# Patient Record
Sex: Female | Born: 1955 | Race: White | Hispanic: No | Smoking: Never smoker
Health system: Southern US, Community
[De-identification: ages and names within clinical notes are randomized; demographics above are authoritative.]

## PROBLEM LIST (undated history)

## (undated) DIAGNOSIS — K5792 Diverticulitis of intestine, part unspecified, without perforation or abscess without bleeding: Secondary | ICD-10-CM

## (undated) DIAGNOSIS — I10 Essential (primary) hypertension: Secondary | ICD-10-CM

## (undated) HISTORY — PX: ABDOMINAL HYSTERECTOMY: SHX81

## (undated) HISTORY — PX: TONSILLECTOMY: SUR1361

## (undated) HISTORY — DX: Essential (primary) hypertension: I10

## (undated) HISTORY — PX: CHOLECYSTECTOMY: SHX55

## (undated) HISTORY — PX: KNEE SURGERY: SHX244

---

## 2001-03-25 ENCOUNTER — Encounter: Payer: Self-pay | Admitting: Family Medicine

## 2001-03-25 ENCOUNTER — Ambulatory Visit (HOSPITAL_COMMUNITY): Admission: RE | Admit: 2001-03-25 | Discharge: 2001-03-25 | Payer: Self-pay | Admitting: Family Medicine

## 2001-06-09 ENCOUNTER — Ambulatory Visit (HOSPITAL_COMMUNITY): Admission: RE | Admit: 2001-06-09 | Discharge: 2001-06-09 | Payer: Self-pay | Admitting: Family Medicine

## 2001-06-09 ENCOUNTER — Encounter: Payer: Self-pay | Admitting: Family Medicine

## 2001-07-16 ENCOUNTER — Ambulatory Visit (HOSPITAL_COMMUNITY): Admission: RE | Admit: 2001-07-16 | Discharge: 2001-07-16 | Payer: Self-pay | Admitting: General Surgery

## 2002-10-20 ENCOUNTER — Ambulatory Visit (HOSPITAL_COMMUNITY): Admission: RE | Admit: 2002-10-20 | Discharge: 2002-10-20 | Payer: Self-pay | Admitting: Family Medicine

## 2002-10-20 ENCOUNTER — Encounter: Payer: Self-pay | Admitting: Family Medicine

## 2002-10-25 ENCOUNTER — Encounter: Payer: Self-pay | Admitting: *Deleted

## 2002-10-25 ENCOUNTER — Emergency Department (HOSPITAL_COMMUNITY): Admission: EM | Admit: 2002-10-25 | Discharge: 2002-10-25 | Payer: Self-pay | Admitting: *Deleted

## 2002-11-17 ENCOUNTER — Emergency Department (HOSPITAL_COMMUNITY): Admission: EM | Admit: 2002-11-17 | Discharge: 2002-11-17 | Payer: Self-pay | Admitting: Internal Medicine

## 2002-11-17 ENCOUNTER — Encounter: Payer: Self-pay | Admitting: Internal Medicine

## 2002-11-18 ENCOUNTER — Encounter: Payer: Self-pay | Admitting: Family Medicine

## 2002-11-18 ENCOUNTER — Ambulatory Visit (HOSPITAL_COMMUNITY): Admission: RE | Admit: 2002-11-18 | Discharge: 2002-11-18 | Payer: Self-pay | Admitting: Family Medicine

## 2002-11-23 ENCOUNTER — Encounter (HOSPITAL_COMMUNITY): Admission: RE | Admit: 2002-11-23 | Discharge: 2002-12-23 | Payer: Self-pay | Admitting: Family Medicine

## 2002-11-23 ENCOUNTER — Encounter: Payer: Self-pay | Admitting: Family Medicine

## 2002-12-09 ENCOUNTER — Ambulatory Visit (HOSPITAL_COMMUNITY): Admission: RE | Admit: 2002-12-09 | Discharge: 2002-12-09 | Payer: Self-pay | Admitting: Internal Medicine

## 2002-12-16 ENCOUNTER — Observation Stay (HOSPITAL_COMMUNITY): Admission: RE | Admit: 2002-12-16 | Discharge: 2002-12-17 | Payer: Self-pay | Admitting: General Surgery

## 2005-04-09 ENCOUNTER — Encounter (HOSPITAL_COMMUNITY): Admission: RE | Admit: 2005-04-09 | Discharge: 2005-04-10 | Payer: Self-pay | Admitting: Family Medicine

## 2005-04-10 ENCOUNTER — Ambulatory Visit: Payer: Self-pay | Admitting: Orthopedic Surgery

## 2006-09-24 ENCOUNTER — Observation Stay (HOSPITAL_COMMUNITY): Admission: RE | Admit: 2006-09-24 | Discharge: 2006-09-25 | Payer: Self-pay | Admitting: Obstetrics and Gynecology

## 2006-09-24 ENCOUNTER — Encounter (INDEPENDENT_AMBULATORY_CARE_PROVIDER_SITE_OTHER): Payer: Self-pay | Admitting: Specialist

## 2006-11-23 ENCOUNTER — Ambulatory Visit (HOSPITAL_COMMUNITY): Admission: RE | Admit: 2006-11-23 | Discharge: 2006-11-23 | Payer: Self-pay | Admitting: Family Medicine

## 2007-02-02 ENCOUNTER — Encounter (INDEPENDENT_AMBULATORY_CARE_PROVIDER_SITE_OTHER): Payer: Self-pay | Admitting: General Surgery

## 2007-02-02 ENCOUNTER — Ambulatory Visit (HOSPITAL_COMMUNITY): Admission: RE | Admit: 2007-02-02 | Discharge: 2007-02-02 | Payer: Self-pay | Admitting: General Surgery

## 2007-02-05 ENCOUNTER — Emergency Department (HOSPITAL_COMMUNITY): Admission: EM | Admit: 2007-02-05 | Discharge: 2007-02-05 | Payer: Self-pay | Admitting: Emergency Medicine

## 2007-04-10 ENCOUNTER — Emergency Department (HOSPITAL_COMMUNITY): Admission: EM | Admit: 2007-04-10 | Discharge: 2007-04-10 | Payer: Self-pay | Admitting: Emergency Medicine

## 2007-06-10 ENCOUNTER — Encounter (INDEPENDENT_AMBULATORY_CARE_PROVIDER_SITE_OTHER): Payer: Self-pay | Admitting: Internal Medicine

## 2007-08-23 ENCOUNTER — Encounter: Payer: Self-pay | Admitting: Emergency Medicine

## 2007-08-25 ENCOUNTER — Ambulatory Visit (HOSPITAL_COMMUNITY): Admission: EM | Admit: 2007-08-25 | Discharge: 2007-08-26 | Payer: Self-pay | Admitting: Cardiology

## 2007-08-26 ENCOUNTER — Encounter (INDEPENDENT_AMBULATORY_CARE_PROVIDER_SITE_OTHER): Payer: Self-pay | Admitting: Cardiology

## 2007-08-26 ENCOUNTER — Ambulatory Visit: Payer: Self-pay | Admitting: Vascular Surgery

## 2008-10-12 ENCOUNTER — Ambulatory Visit (HOSPITAL_COMMUNITY): Admission: RE | Admit: 2008-10-12 | Discharge: 2008-10-12 | Payer: Self-pay | Admitting: Family Medicine

## 2009-09-24 ENCOUNTER — Emergency Department (HOSPITAL_COMMUNITY): Admission: EM | Admit: 2009-09-24 | Discharge: 2009-09-24 | Payer: Self-pay | Admitting: Emergency Medicine

## 2010-08-18 ENCOUNTER — Emergency Department (HOSPITAL_COMMUNITY)
Admission: EM | Admit: 2010-08-18 | Discharge: 2010-08-18 | Payer: Self-pay | Source: Home / Self Care | Admitting: Emergency Medicine

## 2010-08-26 LAB — URINALYSIS, ROUTINE W REFLEX MICROSCOPIC
Bilirubin Urine: NEGATIVE
Hgb urine dipstick: NEGATIVE
Ketones, ur: NEGATIVE mg/dL
Nitrite: NEGATIVE
Protein, ur: NEGATIVE mg/dL
Specific Gravity, Urine: >1.03 — ABNORMAL HIGH (ref 1.005–1.030)
Urine Glucose, Fasting: NEGATIVE mg/dL
Urobilinogen, UA: 0.2 mg/dL (ref 0.0–1.0)
pH: 5.5 (ref 5.0–8.0)

## 2010-08-26 LAB — DIFFERENTIAL
Basophils Absolute: 0 10*3/uL (ref 0.0–0.1)
Basophils Relative: 0 % (ref 0–1)
Eosinophils Absolute: 0.2 10*3/uL (ref 0.0–0.7)
Eosinophils Relative: 2 % (ref 0–5)
Lymphocytes Relative: 47 % — ABNORMAL HIGH (ref 12–46)
Lymphs Abs: 3.3 10*3/uL (ref 0.7–4.0)
Monocytes Absolute: 0.4 10*3/uL (ref 0.1–1.0)
Monocytes Relative: 6 % (ref 3–12)
Neutro Abs: 3.1 10*3/uL (ref 1.7–7.7)
Neutrophils Relative %: 45 % (ref 43–77)

## 2010-08-26 LAB — BASIC METABOLIC PANEL
BUN: 9 mg/dL (ref 6–23)
CO2: 28 mEq/L (ref 19–32)
Calcium: 9.2 mg/dL (ref 8.4–10.5)
Chloride: 99 mEq/L (ref 96–112)
Creatinine, Ser: 0.8 mg/dL (ref 0.4–1.2)
GFR calc Af Amer: >60 mL/min (ref >60)
GFR calc non Af Amer: >60 mL/min (ref >60)
Glucose, Bld: 210 mg/dL — ABNORMAL HIGH (ref 70–99)
Potassium: 3.4 mEq/L — ABNORMAL LOW (ref 3.5–5.1)
Sodium: 135 mEq/L (ref 135–145)

## 2010-08-26 LAB — CBC
HCT: 39.2 % (ref 36.0–46.0)
Hemoglobin: 13.5 g/dL (ref 12.0–15.0)
MCH: 28.9 pg (ref 26.0–34.0)
MCHC: 34.4 g/dL (ref 30.0–36.0)
MCV: 83.9 fL (ref 78.0–100.0)
Platelets: 224 10*3/uL (ref 150–400)
RBC: 4.67 MIL/uL (ref 3.87–5.11)
RDW: 12.9 % (ref 11.5–15.5)
WBC: 7 10*3/uL (ref 4.0–10.5)

## 2010-09-01 ENCOUNTER — Encounter: Payer: Self-pay | Admitting: Internal Medicine

## 2010-10-30 LAB — URINALYSIS, ROUTINE W REFLEX MICROSCOPIC
Bilirubin Urine: NEGATIVE
Glucose, UA: NEGATIVE mg/dL
Hgb urine dipstick: NEGATIVE
Nitrite: NEGATIVE
Protein, ur: NEGATIVE mg/dL
Specific Gravity, Urine: 1.025 (ref 1.005–1.030)
Urobilinogen, UA: 0.2 mg/dL (ref 0.0–1.0)
pH: 6 (ref 5.0–8.0)

## 2010-10-30 LAB — GLUCOSE, CAPILLARY: Glucose-Capillary: 141 mg/dL — ABNORMAL HIGH (ref 70–99)

## 2010-12-24 NOTE — Procedures (Signed)
Denise Hartman, Denise Hartman                 ACCOUNT NO.:  [id number]   MEDICAL RECORD NO.:  [id number]          PATIENT TYPE:  OBV   LOCATION:  A203                          FACILITY:  [facility]   PHYSICIAN:  Dani Gobble, MD       DATE OF BIRTH:  13-Aug-1955   DATE OF PROCEDURE:  DATE OF DISCHARGE:                                ECHOCARDIOGRAM   REFERRING PHYSICIAN:  Dorris Singh, D.O.  Dani Gobble, M.D.   INDICATIONS:  A 55 year old female with a past medical history of  hypertension, diabetes mellitus, shortness of breath and chest pain.   TECHNICAL QUALITY:  The study is adequate.   The aorta measures normally at 2.6 cm.   No suggestion of aneurysm or dissection in the aortic root and proximal  ascending aorta that was visualized.   The left atrium grossly appears to be normal in size.  The patient  appeared to be in sinus rhythm during the procedure.   The interventricular septum and posterior wall were within normal limits  at 0.9 cm and 1.1 cm, respectively.   The aortic valve is trileaflet and pliable with normal leaflet  excursion.  No aortic insufficiency is noted.  Doppler interrogation of  the aortic valve is within normal limits.   The mitral valve reveals a minimally thickened anterior leaflet but  without limitation of leaflet excursion.  Mild mitral regurgitation is  noted.  Doppler interrogation of the mitral valve is within normal  limits.   The pulmonic valve appears grossly structurally normal.   Tricuspid valve also appears grossly structurally normal with mild  tricuspid regurgitation noted.   The left ventricle is normal in size with the LV IDD measured at 4.3 cm,  LV IC measured at 3.3 cm.  Overall left systolic function is normal and  no regional wall motion abnormalities are noted.   The right atrium appears mildly dilated where the right ventricle  appears moderately dilated with preserved right ventricular systolic  function.   There is a small  anterior echo-free space which most likely represents a  fat pad, but the possibility of a small loculated pericardial effusion  cannot be entirely excluded.  However, there is no evidence of  hemodynamic compromise.   IMPRESSION:  1. Mild mitral and tricuspid regurgitation.  2. Mildly thickened anterior leaflet of the mitral valve without      limitation leaflet excursion.  3. Normal left ventricular size and systolic function without regional      wall motion abnormality noted.  4. Mild right atrial enlargement.  5. Moderate right ventricular enlargement with preserved right      ventricular systolic function.  6. Small anterior echo-free space without hemodynamic compromise which      may represent fat pad or a small insignificant loculated      pericardial effusion.           ______________________________  Dani Gobble, MD     AB/MEDQ  D:  08/24/2007  T:  08/25/2007  Job:  [id number]   cc:   Dorris Singh, DO   Dani Gobble, MD  Fax: [phone]

## 2010-12-24 NOTE — Discharge Summary (Signed)
Denise Hartman, Denise Hartman                 ACCOUNT NO.:  [id number]   MEDICAL RECORD NO.:  [id number]          PATIENT TYPE:  INP   LOCATION:  2022                         FACILITY:  [facility]   PHYSICIAN:  Dani Gobble, MD       DATE OF BIRTH:  [birth year]   DATE OF ADMISSION:  08/25/2007  DATE OF DISCHARGE:  08/26/2007                               DISCHARGE SUMMARY   DISCHARGE DIAGNOSES:  1. Chest pain, probably musculoskeletal, with catheterization this      admission revealing normal coronaries and normal left ventricular      function.  2. Non-insulin-dependent diabetes.  3. Obesity.  4. Treated hypertension.  5. Treated dyslipidemia.   HOSPITAL COURSE:  The patient is a 55 year old female who was admitted  August 24, 2007, with chest pain while walking around at [facility]  She  has multiple risk factors for coronary disease.  She was seen initially  at [facility] and then transferred to [facility].  A chest CT was  done at [facility], which showed minimal basilar atelectasis  with no pulmonary embolism.  Echocardiogram revealed mild TR, normal LV  function, and a small anterior echo-free space without hemodynamic  compromise, which may represent a fat pad or a small insignificant  pericardial effusion.  The patient underwent a diagnostic  catheterization August 25, 2007, by Dr. Elsie Lincoln, which revealed normal  coronaries and normal LV function.  She tolerated this well.  We were  going to discharge her on the 14th, but she had more chest pain after  her cath.  A PPI was added as well as GI cocktail.  She was seen the  morning of the 14th.  She continued to have some vague mid sternal pain  which was worse with inspiration.  She also had some mild right upper  quadrant pain.  She is status post cholecystectomy in 2003.  She did  have a D-dimer that was trace positive at [facility] and we did lower  extremity venous Dopplers which were negative for DVT.  We gave her a  dose of  Toradol and PPI.  She is better in the afternoon of the 15th and  we feel she can be discharged.  At discharge, she does admit that she is  under a great deal of stress at work, and she thinks this may have  contributed to her symptoms.  We will discharge her on Actos/metformin  850/15 twice a day, Benicar 20 mg daily, Vytorin 10/20 daily, Prilosec  20 mg a day, Naprosyn 250 mg twice a day for 5 days.   LABORATORIES:  White count 15.7, hemoglobin 12.8, hematocrit 37.6,  platelets 254.  Sodium 136, potassium 4, BUN 14, creatinine 0.89.  LFTs  were normal.  CK-MB and troponins were negative x3.  LDL was 59, HDL 57.  Urine culture shows no growth, and H. pylori is pending.  Sed rate is 5.  TSH 1.92.  D-dimer is 0.49 here.   DISPOSITION:  The patient is discharged in stable condition.  She will  follow up with Dr. Domingo Sep in  a couple of weeks.  She will need a  followup CBC before seeing Dr. Domingo Sep.      Abelino Derrick, P.A.    ______________________________  Dani Gobble, MD    LKK/MEDQ  D:  08/26/2007  T:  08/26/2007  Job:  [id number]   cc:   Dani Gobble, MD  Patrica Duel, M.D.

## 2010-12-24 NOTE — Consult Note (Signed)
NAME:  Denise Hartman, Denise Hartman                 ACCOUNT NO.:  [id number]   MEDICAL RECORD NO.:  [id number]          PATIENT TYPE:  EMS   LOCATION:  ED                            FACILITY:  [facility]   PHYSICIAN:  Dalia Heading, M.D.  DATE OF BIRTH:  05-16-1956   DATE OF CONSULTATION:  02/05/2007  DATE OF DISCHARGE:                                 CONSULTATION   REFERRING PHYSICIAN:  Emergency room.   HISTORY OF PRESENT ILLNESS:  The patient is a 55 year old, white female  with a several-week history of intermittent hematochezia and rectal pain  who presented to the emergency room with worsening rectal pain and  hematochezia.  She is status post a colonoscopy with polypectomy earlier  this week.  The polyp was negative for malignancy.  She denies any  abdominal pain.  Her pain is primarily rectal and is made worse with  bowel movements.   PAST MEDICAL HISTORY:  As noted on the chart.   PHYSICAL EXAMINATION:  GENERAL:  The patient is well-developed, well-  nourished, white female in moderate discomfort.  ABDOMEN:  Benign.  RECTAL:  A posterior anal fissure.  Sphincter tone is tight, but this is  probably secondary to pain.  Minimal blood was noted at the time of  rectal examination.   LABORATORY DATA AND X-RAY FINDINGS:  Hematocrit 37.5.   IMPRESSION:  Anal fissure.   RECOMMENDATIONS:  The patient will be started on Dilaudid for pain and  Anusol HC suppositories.  She is to follow up in my office in 1-2 weeks.  She has been told that this is not related to the colonoscopy and should  this problem become chronic in nature, surgery may be indicated.      Dalia Heading, M.D.  Electronically Signed     MAJ/MEDQ  D:  02/05/2007  T:  02/05/2007  Job:  [id number]   cc:   [facility]

## 2010-12-24 NOTE — H&P (Signed)
Denise Hartman, Denise Hartman                 ACCOUNT NO.:  [id number]   MEDICAL RECORD NO.:  [id number]          PATIENT TYPE:  AMB   LOCATION:                                FACILITY:  [facility]   PHYSICIAN:  Dalia Heading, M.D.  DATE OF BIRTH:  1955-09-11   DATE OF ADMISSION:  02/02/2007  DATE OF DISCHARGE:  LH                              HISTORY & PHYSICAL   CHIEF COMPLAINT:  Hematochezia.   HISTORY OF PRESENT ILLNESS:  The patient is a 55 year old white female  who was referred for endoscopic evaluation.  She needs a colonoscopy for  hematochezia. She has been passing some blood per rectum over the past  month.  No abdominal pain, weight loss, nausea, vomiting, diarrhea,  constipation, or melena have been noted. She has never had a  colonoscopy.  There is no family history of colon carcinoma.   PAST MEDICAL HISTORY:  1. Non insulin dependent diabetes mellitus.  2. Hypertension.   PAST SURGICAL HISTORY:  Noncontributory.   CURRENT MEDICATIONS:  1. ACTOplus.  2. Benicar.  3. Vytorin.   ALLERGIES:  SHELLFISH.   REVIEW OF SYSTEMS:  Noncontributory.   PHYSICAL EXAMINATION:  GENERAL:  The patient is a well-developed, well-  nourished white female in no acute distress.  LUNGS:  Clear to auscultation with equal breath sounds bilaterally.  HEART:  Reveals a regular rate and rhythm without S3, S4, or murmurs.  ABDOMEN:  Soft, nontender, nondistended. No hepatosplenomegaly or masses  are noted.  RECTAL EXAMINATION:  Deferred to the procedure.   IMPRESSION:  Hematochezia.   PLAN:  The patient is scheduled for a colonoscopy on February 02, 2007.  The  risks and benefits of the procedure including bleeding and perforation  were fully explained to the patient, gave informed consent.      Dalia Heading, M.D.  Electronically Signed     MAJ/MEDQ  D:  01/26/2007  T:  01/26/2007  Job:  [id number]   cc:   Patrica Duel, M.D.  Fax: [phone]

## 2010-12-24 NOTE — Group Therapy Note (Signed)
Denise Hartman, Denise Hartman                 ACCOUNT NO.:  [id number]   MEDICAL RECORD NO.:  [id number]          PATIENT TYPE:  INP   LOCATION:  A203                          FACILITY:  [facility]   PHYSICIAN:  Skeet Latch, DO    DATE OF BIRTH:  [birth year]   DATE OF PROCEDURE:  08/25/2007  DATE OF DISCHARGE:                                 PROGRESS NOTE   SUBJECTIVE:  The patient states that her pain seems to be improved  today.  The patient still complains of pressure-like sensation in the  middle of her chest this morning, but it seems to be better.  She denies  any vomiting, nausea, fever, chills.  The patient supposedly is going to  be transferred to [facility] for cardiac catheterization.   OBJECTIVE:  VITAL SIGNS:  Temperature 97.6, pulse 90, respirations 18,  blood pressure 129/79, patient saturating 97% on room air.  GENERAL:  She is 55 years old, well-developed, well-nourished, well-  hydrated.  Does not appear in any acute distress. She is pleasant and  cooperative.  CARDIOVASCULAR:  Regular rate and rhythm.  No murmurs, rubs or gallops.  LUNGS:  Clear to auscultation bilaterally.  No rhonchi, rales or  wheezes.  ABDOMEN:  Obese, soft, some epigastric tenderness on deep palpation.  Positive bowel sounds.  EXTREMITIES:  No cyanosis, clubbing or edema is noted.   ASSESSMENT/PLAN:  1. Chest pain.  Will continue current care and await patient to be      transferred to [facility] for cardiac catheterization.  2. Diabetes.  Continue home medications as well as insulin sliding      scale.  3. Hypertension.  Continue current treatment as her blood pressure      seems to be in normal range.      Skeet Latch, DO  Electronically Signed     SM/MEDQ  D:  08/25/2007  T:  08/25/2007  Job:  [id number]

## 2010-12-24 NOTE — H&P (Signed)
Denise Hartman, Denise Hartman                 ACCOUNT NO.:  [id number]   MEDICAL RECORD NO.:  [id number]          PATIENT TYPE:  INP   LOCATION:  A203                          FACILITY:  [facility]   PHYSICIAN:  Dorris Singh, DO    DATE OF BIRTH:  04-Jan-1956   DATE OF ADMISSION:  08/23/2007  DATE OF DISCHARGE:  01/13/2009LH                              HISTORY & PHYSICAL   The patient is a 55 year old female who is a patient of Dr. Nobie Hartman who  presented with chief complaint of chest pain which she says started 2  days ago.  It was gradual in onset.  She stated that it was located in  the substernal region.  There was nothing that made it better and was  rated at 6/10, described as moderate.  There were no relieving factors  and has been associated with nausea.   PAST MEDICAL HISTORY:  1. Hypertension.  2. Diabetes.  3. Hemorrhoids.  4. Hypercholesterolemia.   She has had a cholecystectomy, hysterectomy, knee surgery, oophorectomy,  and tonsillectomy.   She is a nonsmoker, nondrinker, no drug abuse.  She is married.   She has an allergy to SULFA.   She is currently on medications of:  1. Benicar 20 once a day.  2. Vytorin 10/20 once a day.  3. Actos 15/85 twice a day.   REVIEW OF SYSTEMS:  CONSTITUTIONAL:  Negative for weakness or fatigues.  EYES:  Negative for changes in vision.  EARS, NOSE, MOUTH, THROAT:  Negative for ear pain, nose pain, mouth pain, or change in teeth.  CARDIOVASCULAR:  Positive for chest pain.  RESPIRATORY:  Negative for  dyspnea or shortness of breath.  GASTROINTESTINAL:  Positive for nausea.  Negative for constipation or diarrhea. GU:  Negative for dysuria or  hematuria.  MUSCULOSKELETAL:  Negative for arthralgias.  SKIN:  Negative  for any kind of rashes.  NEUROLOGIC:  Negative for headache, dizziness,  or syncope.  PSYCHIATRIC:  Negative for anxiety or depression.  ENDOCRINE:  Negative for intolerance to heat or cold.  HEMATOLOGIC:  Negative for anemia.   PHYSICAL EXAMINATION:  VITAL SIGNS:  Blood pressure 114/58, pulse rate  60, respirations 20,  pulse oximetry 99% on room air.  GENERAL:  Well-developed, well-nourished [name] who is in no  acute distress.  HEENT:  Head is normocephalic and atraumatic.  Eyes are PERRLA.  EOMI.  Ears: TMs visualized bilaterally.  Nose:  No significant turbinates,  moist.  Throat: No erythema or exudate noted.  NECK:  Supple.  No lymphadenopathy.  CARDIOVASCULAR:  Regular rate and rhythm.  No murmurs, rubs, or gallops.  RESPIRATORY:  Clear to auscultation bilaterally.  No wheezes, rales,  rhonchi.  ABDOMEN:  Soft, nontender, nondistended.  No organomegaly.  Bowel sounds  in all four quadrants.  EXTREMITIES:  Positive pulses.  No ecchymosis, edema, or cyanosis.  NEUROLOGIC:  Cranial nerves II-XII grossly intact.  Grip strength, good  strength in all extremities.   LABORATORY DATA:  D-dimer 0.49 which is elevated. Cardiac markers were  negative.  Lipase was 40.  Her chemistry was sodium  138,  potassium 3.9,  chloride 103, carbon dioxide 28,  glucose 117, BUN 9, creatinine 0.79.   Chest x-ray was negative.   She had a CT done which was normal.   INR within normal limits.  CBC within normal limits.   DIAGNOSES:  1. Chest pain.  2. Diabetes.  3. Hypertension.   Will go ahead and admit her to 2A with telemetry.  Will have  [facility] come and participate.  DVT and GI prophylaxes.  Will complete cardiac markers q. 6 h x3.  Will also send for  echocardiogram and will continue to monitor and change therapies as  needed.      Dorris Singh, DO  Electronically Signed     CB/MEDQ  D:  08/23/2007  T:  08/23/2007  Job:  [id number]

## 2010-12-24 NOTE — Op Note (Signed)
Denise Hartman, Denise Hartman                 ACCOUNT NO.:  [id number]   MEDICAL RECORD NO.:  [id number]          PATIENT TYPE:  OUT   LOCATION:  CATH                         FACILITY:  [facility]   PHYSICIAN:  Madaline Savage, M.D.DATE OF BIRTH:  January 25, 1956   DATE OF PROCEDURE:  08/25/2007  DATE OF DISCHARGE:                               OPERATIVE REPORT   PROCEDURE:  1. Selective coronary angiography by Judkins technique.  2. Retrograde left heart catheterization.  3. Left ventricular angiography.  4. Right heart catheterization with hemodynamic pressure acquisition.  5. Thermodilution and Fick cardiac output determinations.   INTERVENTIONS:  None.   PATIENT PROFILE:  The patient is a 55 year old patient of Patrica Duel,  M.D. who presented to [facility] with a chief  complaint of chest pain starting two days ago before being admitted to  [facility] on August 23, 2007.  It was a substernal  pain that was 6/10 described as being moderate and there were no  relieving factors.  It was associated with nausea.  Apparently no  nitroglycerin was given for the pain.  Past history shows that the  patient has had hypertension, diabetes, hypercholesterolemia, and her  previous history is that of previous cholecystectomy, hysterectomy, knee  surgery, tonsillectomy, and oophorectomy.   The patient is a nonsmoker, nondrinker, and non drug abuser who is  married.  While hospitalized at [facility] the patient  had negative cardiac enzymes.  She did have a shellfish allergy that we  premedicated her for today with steroids and famotidine.  This cardiac  catheterization was performed in lab 6 at [facility] without  complications and a right and left heart catheterization was performed.   RESULTS:   PRESSURES:  Right atrial mean pressure was 6, right ventricular pressure  35/1, end diastolic pressure 9, pulmonary artery pressure 34/14, mean  of  26.  Pulmonary capillary wedge mean pressure was 22, left ventricular  pressure 120/1, end diastolic pressure 16.  Central aortic pressure  120/64.  Fick cardiac outputs 3.83, thermodilution cardiac outputs 3.07.   ANGIOGRAPHY:  The coronary arteries were all normal without any evidence  of plaque formation or arterial narrowing.  The right coronary artery  was dominant giving rise to a posterolateral equal in size to the large  posterior descending branch.  The circumflex was nondominant.  The LAD  contained one diagonal branch arising before the septal perforator  branch #1 and both LAD and diagonal were normal.   IMPRESSION:  1. Recent chest pain in patient with multiple risk factors.  2. Angiographically patent coronary arteries.  3. Mildly elevated right heart pressures with normal left ventricular      pressures.  4. Normal cardiac outputs.   PLAN:  The patient will be observed here at [facility] (the  patient was transferred from [facility] as an  outpatient).  She will be observed here at [facility] for approximately six  hours and ambulated and if she does well with her ambulation and there  are no complications, she will  be discharged back to [place] to the  excellent care of Dr. Nobie Putnam and the excellent care of Dr. Domingo Sep,  her cardiologist.           ______________________________  Madaline Savage, M.D.     WHG/MEDQ  D:  08/25/2007  T:  08/25/2007  Job:  [id number]   cc:   Patrica Duel, M.D.  Fax: [phone]   Dani Gobble, MD  Fax: [phone]

## 2010-12-24 NOTE — Group Therapy Note (Signed)
Denise Hartman, Denise Hartman                 ACCOUNT NO.:  [id number]   MEDICAL RECORD NO.:  [id number]          PATIENT TYPE:  INP   LOCATION:  A203                          FACILITY:  [facility]   PHYSICIAN:  Dorris Singh, DO    DATE OF BIRTH:  07-31-1956   DATE OF PROCEDURE:  DATE OF DISCHARGE:  08/25/2007                                 PROGRESS NOTE   She had one episode of chest pain that was resolved with pain  medication.  Denies any nausea, vomiting, fever or chills over the  night.  She was seen by Cardiology today and await their further  recommendations and testing.   Her vitals for today are as follows:  Blood pressure is 96/51,  temperature 98.7, pulse 66, respirations 16.  GENERALLY:  This is a 55 year old female who is well-developed, well-  nourished, in no acute distress.  HEART:  Regular rate and rhythm, no murmur noted, S1 and S2 present.  LUNGS:  Clear to auscultation bilaterally.  No wheezes, rales or  rhonchi.  ABDOMEN:  Soft, nontender, nondistended, no organomegaly noted.  EXTREMITIES:  Positive pulses x4.  No edema, ecchymosis or cyanosis.   Her labs for this morning are as follows:  Her CBC, he hemoglobin is  less than 0.6 and hematocrit 34.4, may be dilutional effect.  Will  continue to monitor and platelet count of 244.  Her BMET, everything is  within normal limits except for her glucose which was 134 at the time  that it was taken.  The cardiac enzymes today are within normal limits  and her lipid studies, her total cholesterol is 132 with her LDL 59 and  her HDL at 57.   ASSESSMENT/PLAN:  1. Chest pain.  2. Diabetes.  3. Hypertension.   Await any further recommendations from Cardiology.  Once patient is  cleared, will consider discharging her to home today, if not in the next  24 hours.      Dorris Singh, DO  Electronically Signed     CB/MEDQ  D:  08/24/2007  T:  08/24/2007  Job:  [id number]

## 2010-12-27 NOTE — Op Note (Signed)
NAME:  Denise Hartman, Denise Hartman                 ACCOUNT NO.:  [id number]   MEDICAL RECORD NO.:  [id number]          PATIENT TYPE:  OBV   LOCATION:  A428                          FACILITY:  [facility]   PHYSICIAN:  Tilda Burrow, M.D. DATE OF BIRTH:  [birth year]   DATE OF PROCEDURE:  09/24/2006  DATE OF DISCHARGE:                               OPERATIVE REPORT   PREOPERATIVE DIAGNOSIS:  Pelvic pain, suspected ovarian entrapment, left  side predominance.   POSTOPERATIVE DIAGNOSIS:  Pelvic pain and pelvic adhesions to both  ovaries.  Extensive upper abdominal adhesions, diffuse lower abdominal  adhesions, thin, filmy.   OPERATION PERFORMED:  Laparoscopic bilateral oophorectomy, lysis of  lower abdominal adhesions ([place]).   SURGEON:  Tilda Burrow, M.D.   ASSISTANTAmie Critchley, CST.   ANESTHESIA:  General.   COMPLICATIONS:  None.   FINDINGS:  Omental adhesions to the anterior abdominal wall just to the  right of the midline, and extensively involving the right upper  quadrant.  Thin filmy adhesions to the anterior abdominal wall, from  lower abdominal epiploic fat, resolved, corrected.  Adhesions from  sigmoid colon overlying left tube and left ovary.  Right ovary adherent  to the right pelvic brim.   DESCRIPTION OF PROCEDURE:  The patient was taken to the operating room,  prepped and draped in the usual fashion for combined abdominal and  vaginal procedure with low lithotomy leg support.  The patient had a  catheterization with the red Roxan Hockey, which was allowed to drain  passively through the case.  Single toothed tenaculum was attached to  the vaginal cuff for cuff manipulation.  We then proceeded with  umbilical vertical inferior incision and introduced the Veress needle  with caution due to prior supraumbilical site.  Water droplet test was  successful on first attempt and so we insufflated another 9 mmHg  pressure.  Laparoscopic trocar 11 mm was inserted under direct  visualization  and revealed safe insertion, just to the left of omental  attachments to the anterior abdominal wall.  The lower abdomen and  suprapubic site could be visualized by retracting the adhesions to the  patient's right side and then we were able to place a 5 mm suprapubic.  Unipolar cutting scissors were then used to take down thin filmy  adhesions from the omentum to the infraumbilical area so that we could  fully visualize the lower abdomen.  The left lower quadrant trocar was  inserted without difficulty under direct visualization. The suprapubic  trocar was converted to a 12 mm port.  We then proceeded with inspection  of the pelvis and progressive adhesiolysis.  The thin filmy adhesions to  the pelvis were lysed using countertraction and laparoscopic scissors  until the ovary could be visualized.  At this point the ovary was  visualized sufficiently that it was apparent that we could be able to  extract the left tube and ovary laparoscopically.  Then we switched to  Harmonic scalpel.  During all of the laparoscopic unipolar scissors, we  were particularly careful to stay away from the bowel and had no  incidences of concern.   The omental adhesions were left in place unless specifically interfering  with visualization.  We then were able to free up the inferior tip of  the left tube, place it on countertraction with an excellent development  of the left infundibulopelvic ligament.  We were able to cross-clamp the  infundibulopelvic ligament with the Harmonic scalpel and remove the  ovary.  There was no evidence of the tube.  We then were able to remove  this tube and ovary by pulling it into the 12 mm trocar site and  bivalving it and extracting it in toto.  On the right side, we worked  around the omental attachments to the anterior abdominal wall and  identified the right ovary on the pelvic brim overlying the major  vessels.  Counter traction could be applied and it was much more  easily  identified.  A cleavage plane could be successfully identified and using  Harmonic scalpel to detach the peritoneal surfaces from the ovary, we  were able to isolate the right ovary all the way up to the right  infundibulopelvic ligament which was high on the side wall.  The ureter  was not visualized but we were well out of the area where it should be  any concern.  The right IP ligament was then transected using careful  positioning and serial coagulation with Harmonic scalpel.  Hemostasis  was excellent through this portion of the case and photos taken before  and after removal of the right tube.  The photo #1 actually shows the  adhesions of the upper abdomen.  At the end of the case, we inspected  the pelvis, checked for any bleeding, found none.  By the time we  irrigated the pelvis copiously, we left a couple of 100 mL in the  abdomen.  Once the irrigation was clear, deflated the abdomen, and  removed the laparoscopic equipment and closed the Scarpa's fascia with 0  Vicryl in the umbilical and suprapubic sites.  Due to the patient's body  habitus, we were not able to reach the fairly small defect in the fascia  at each site.  The patient then had closure of each incision site with  subcuticular 4-0 Dexon and staple closure of the skin.  Sponge and  needle counts were correct.  The patient went to recovery in good  condition.      Tilda Burrow, M.D.  Electronically Signed     JVF/MEDQ  D:  09/24/2006  T:  09/24/2006  Job:  [id number]

## 2010-12-27 NOTE — Consult Note (Signed)
Denise Hartman, Denise Hartman                             ACCOUNT NO.:  [id number]   MEDICAL RECORD NO.:  [id number]                  PATIENT TYPE:   LOCATION:                                       FACILITY:  [facility]   PHYSICIAN:  Denise Hartman, M.D.                 DATE OF BIRTH:  Jan 20, 1956   DATE OF CONSULTATION:  12/06/2002  DATE OF DISCHARGE:                                   CONSULTATION   REPORT TITLE:  GASTROENTEROLOGY CONSULTATION   REFERRING PHYSICIAN:  Patrica Hartman, M.D.   REASON FOR CONSULTATION:  Nausea, vomiting, upper abdominal pain.   HISTORY OF PRESENT ILLNESS:  The patient is a 55 year old [name],  patient of Dr. Nobie Hartman, who presents today for further evaluation of the  above-stated symptoms.  She states about six weeks ago she developed mid to  left chest/abdominal pain which radiated into her back.  She saw Dr.  Domingo Hartman who performed multiple tests, including a stress test, which were  unremarkable.  The patient states that cardiac etiology was ruled out.   She has also had some nausea, vomiting and upper abdominal pain/right upper  quadrant abdominal pain postprandially.  The symptoms generally occur about  an hour to several hours after eating.  She also notes that if she does not  sit up straight or tends to slouch, she has pain in the right upper quadrant  region.  She has also noticed a knot above her right lower rib margin.  She  has typical heartburn symptoms which are well controlled on Nexium.  Denies  any dysphagia, odynophagia.  Denies any melena or rectal bleeding.  Recently, she has been having more stools than usual, up to 4-5 loose stools  daily.   She has had a HIDA scan with fatty milk challenge which revealed a  gallbladder EF of 39%.  She states that it did not reproduce her pain at  that time.  However, she developed vomiting several hours later.  She also  had abdominal ultrasound which revealed fatty infiltration of the liver but,  otherwise, unremarkable.  Her common bile duct measured 4.5 mm.  She had  blood work done today.  However, results are not available.   CURRENT MEDICATIONS:  1. __________ 250/500 mg 1-1/2 tablets b.i.d.  2. Nexium 40 mg daily.  3. TriCor 160 mg daily.  4. Aspirin 81 mg daily.  5. __________ 10 mg daily.   ALLERGIES:  No known drug allergies.   PAST MEDICAL HISTORY:  1. Noninsulin-dependent diabetes mellitus diagnosed five months ago.  2. Gastroesophageal reflux disease.  3. Hypertriglyceridemia.  4. Seasonal allergies.   PAST SURGICAL HISTORY:  1. Status post tubal ligation.  2. Kidney stone extraction.  3. Tonsillectomy.  4. Hysterectomy with bladder tack.  5. Left knee surgery, arthroscopy x4.   FAMILY HISTORY:  Mother and father both have diabetes mellitus.  No family  history of chronic GI illnesses or colorectal cancer.   SOCIAL HISTORY:  She has been married for four years.  Has no children.  She  is employed with [employer].  Never been a smoker.  Denies any  alcohol use.   REVIEW OF SYSTEMS:  Please see HPI for GI.  GENERAL:  Denies any weight  loss.  CARDIOPULMONARY:  Denies any shortness of breath.  Please see HPI.   PHYSICAL EXAMINATION:  VITAL SIGNS:  Weight 201, height 5 feet 7-1/2 inches,  temperature 98.8, blood pressure 110/72, pulse 80.  GENERAL:  A pleasant, well-nourished, well-developed, [name] in no  acute distress.  SKIN:  Warm and dry.  No jaundice.  HEENT:  Pupils are equal, round and reactive to light.  Conjunctivae pink.  Sclerae nonicteric.  Oropharyngeal mucosa moist and pink.  No lesions,  erythema or exudate.  No lymphadenopathy, thyromegaly, carotid bruits.  CHEST:  Lungs clear to auscultation.  CARDIAC:  Regular rate and rhythm.  Normal S1 and S2.  No murmurs, rubs or  gallops.  ABDOMEN:  Positive bowel sounds.  Obese but symmetrical, soft.  She has  moderate tenderness in the right upper quadrant to deep palpation.   She is  unable to take a deep inspiration with palpation in this area.  No  organomegaly or masses appreciated.  Also above the right lower rib margin  is a mobile, approximately 1-1/2 inch, elongated, palpable mass, possibly a  lipoma.  EXTREMITIES:  No edema.   IMPRESSION:  This is a 55 year old lady with chronic gastroesophageal reflux  disease and a six week history of chest pain associated with right upper  quadrant abdominal pain, nausea and vomiting, particularly postprandially.  A HIDA scan revealed a low normal gallbladder ejection fraction.  She did  develop her typical symptoms several hours after the HIDA scan although not  immediately with the fatty meal.  Abdominal ultrasound revealed fatty liver.  We do need to check her liver function tests, the patient believes these to  have been done today although the results are pending.   After discussion with Dr. Karilyn Hartman, we feel that she needs to have an upper  endoscopy for further evaluation of symptoms.  If this is unremarkable and  does not explain her symptoms, then she may need to have a surgical referral  for possible cholecystectomy.   PLAN:  1. EGD in the near future.  2. Continue Nexium 40 mg p.o. daily.  3. Will follow up on labs done today at Dr. Geanie Hartman office.  4.     Phenergan 25 mg suppositories #30 with one refill, one per rectum q.6 h.     p.r.n. nausea  5. Further recommendations to follow.   I would like to thank Dr. Nobie Hartman for allowing [place] to participate in the  care of this patient.     Denise Hartman. Denise Hartman, P.A.-C                   Denise Hartman, M.D.    LSL/MEDQ  D:  12/06/2002  T:  12/06/2002  Job:  [id number]   cc:   Denise Hartman, M.D.  [address]  [place]  [place] [address]  Fax: [phone]

## 2010-12-27 NOTE — Op Note (Signed)
[facility]  Patient:    Denise Hartman, Denise Hartman Visit Number: [id number] MRN: [id number]          Service Type: DSU Location: DAY Attending Physician:  Barbaraann Barthel Dictated by:   Barbaraann Barthel, M.D. Proc. Date: 07/16/01 Admit Date:  07/16/2001 Discharge Date: 07/16/2001   CC:         Patrica Duel, M.D.   Operative Report  PREOPERATIVE DIAGNOSIS:  Right breast mass, nondiagnostic mammogram.  POSTOPERATIVE DIAGNOSIS:  PROCEDURE:  Right partial mastectomy.  SURGEON:  Barbaraann Barthel, M.D.  ANESTHESIA:  General anesthesia.  NOTE:  This is a 56 year old white female with a palpable nodule in the lower medial aspect of the right breast.  She had no axillary adenopathy, no nipple discharge or skin changes.  Mammogram was nondiagnostic.  This nodule had persisted for some time and the patient was extremely cancer phobic and did not wish a period of observation and requested biopsy.  This seemed reasonable and we planned to admit her for outpatient surgery.  We discussed complications not limited to but including bleeding, infection and the possibility of more surgery might be required.  Informed consent was obtained.  GROSS OPERATIVE FINDINGS:  None consistent to be clinically of carcinoma of the breast.  There appeared to be some fibrocystic changes but no abnormalities that I was grossly able to detect.  Final pathology is pending.  TECHNIQUE:  The patient was placed in supine position and after the adequate administration of general anesthesia, her right hemithorax was prepped with Betadine solution and draped in the usual manner.  The palpable nodule which had been previously marked with a marking pin was then identified and a transverse incision was carried out over the palpated mass; breast tissue and subcutaneous tissue were removed.  There appeared to be prominent fibrocystic tissue was the cause of her palpable nodule.  At any rate, I  did not encounter anything abnormal to my mind.  The wound was then irrigated and the bleeding was controlled with a cautery device.  The breast tissue was approximated with 3-0 Polysorb and the skin was cosmetically closed with a 5-0 subcuticular suture with Steri-Strips used to further approximate the skin.  Prior to closure, all sponge, needle and instrument counts were found to be correct. Estimated blood loss was minimal.  No drains were placed.  Patient received 800 cc of crystalloids intraoperatively and she was taken to recovery room in satisfactory condition. Dictated by:   Barbaraann Barthel, M.D. Attending Physician:  Barbaraann Barthel DD:  07/16/01 TD:  07/16/01 Job: [id number]

## 2010-12-27 NOTE — Op Note (Signed)
NAME:  Denise Hartman, Denise Hartman                           ACCOUNT NO.:  [id number]   MEDICAL RECORD NO.:  [id number]                   PATIENT TYPE:  AMB   LOCATION:  DAY                                  FACILITY:  [facility]   PHYSICIAN:  Lionel December, M.D.                 DATE OF BIRTH:  06/20/1956   DATE OF PROCEDURE:  12/09/2002  DATE OF DISCHARGE:                                 OPERATIVE REPORT   PROCEDURE:  Esophagogastroduodenoscopy.   ENDOSCOPIST:  Lionel December, M.D.   INDICATIONS:  [name] is a 55 year old [name] with over a 6-week  history of chest and abdominal pain as well as nausea and vomiting.  She  also has a history of heartburn which has responded to Nexium, but other  symptoms have not.  Her gallbladder ultrasound was negative for  cholelithiasis.  She had a history of fatty liver.  Her LFTs are normal.  HIDA scan revealed an EF of 39% which is normal.  She also has had  noninvasive cardiac evaluation which was negative.  Her feeling is that her  symptoms were due to gallbladder disease although we have not seen any  convincing evidence.  We, therefore, recommended EGD prior to considering  cholecystectomy.  The procedure and risks were reviewed with the patient and  informed consent was obtained.   PREOPERATIVE MEDICATIONS:  Cetacaine spray for oropharyngeal topical  anesthesia, Demerol 50 mg IV and Versed 8 mg IV in divided dose.   INSTRUMENT:  Olympus video system.   FINDINGS:  Procedure performed in endoscopy suite.  The patient's vital  signs and O2 saturation were monitored during the procedure and remained  stable.  The patient was placed in the left lateral recumbent position and  endoscope was passed via the oropharynx without any difficulty into the  esophagus.   ESOPHAGUS:  Mucosa of the esophagus was normal.  Squamocolumnar junction was  unremarkable.   STOMACH:  It was empty and distended very well with insufflation.  The folds  in the proximal  stomach were normal.  Examination of the mucosa reveals  focal erythema but no erosions or ulcers were noted.  The pyloric channel  was patent.  Angularis, fundus, and cardia were examined by retroflexing the  scope and were normal.   DUODENUM:  Examination of the bulb revealed normal mucosa.  Folds in the  mucosa in the second and third part of the duodenum were also normal.   Endoscope was withdrawn.  The patient tolerated the procedure well.   FINAL DIAGNOSES:  1. Essentially normal esophagogastroduodenoscopy.  2. Focal erythema most likely related to her ASA.  3. Her Helicobacter pylori serology recently was negative.   RECOMMENDATIONS:  She will continue antireflux measures, Nexium, and will  refer the patient for a possible cholecystectomy as discussed with Dr.  Nobie Putnam over the phone.  Lionel December, M.D.    NR/MEDQ  D:  12/09/2002  T:  12/09/2002  Job:  [id number]   cc:   Patrica Duel, M.D.  [address]  [place]  [place] [address]  Fax: [phone]

## 2010-12-27 NOTE — Discharge Summary (Signed)
Denise Hartman, Denise Hartman                 ACCOUNT NO.:  [id number]   MEDICAL RECORD NO.:  [id number]          PATIENT TYPE:  OBV   LOCATION:  A428                          FACILITY:  [facility]   PHYSICIAN:  Tilda Burrow, M.D. DATE OF BIRTH:  1956/01/04   DATE OF ADMISSION:  09/24/2006  DATE OF DISCHARGE:  02/15/2008LH                               DISCHARGE SUMMARY   (Overnight observation)   ADMISSION DIAGNOSIS:  Pelvic pain, suspect ovarian entrapment, left-  sided predominance.   DISCHARGE DIAGNOSIS:  Pelvic pain, ovarian entrapment, extensive pelvic  adhesions.   PROCEDURE:  Laparoscopic bilateral salpingo-oophorectomy, lysis of  adhesions.   DISCHARGE MEDICATION:  Vicodin 5/500 one to two q.4h. p.r.n. pain.   PREVIOUS MEDICATIONS:  1. Benicar 20 mg p.o. daily.  2. Actos plus metformin 15/850 one p.o. daily.  3. Allegra D 180 p.o. daily.  4. Xanax 0.5 mg t.i.d. p.r.n.  5. Nexium p.r.n.   HOSPITAL COURSE:  This 55 year old female many years status post  hysterectomy in 1995, was admitted for progressive pelvic pain and  dyspareunia.  Bilateral salpingo-oophorectomy was planned.  Surgical  findings were extensive thin filmy adhesions in the right upper  quadrant, status post cholecystectomy for acute cholecystitis with thin  filmy adhesions throughout the lower pelvis.  It is my personal  suspicion that the acute upper abdomen inflammatory process resulted in  adhesions throughout the abdomen.   We were able to perform a bilateral salpingo-oophorectomy as described  in the HPI without complications and patient was discharged home after  overnight pain management, in excellent stable condition.      Tilda Burrow, M.D.  Electronically Signed     JVF/MEDQ  D:  09/25/2006  T:  09/25/2006  Job:  [id number]   cc:   [facility]   Patrica Duel, M.D.  Fax: [phone]

## 2010-12-27 NOTE — Procedures (Signed)
   NAME:  Denise Hartman, Denise Hartman                           ACCOUNT NO.:  [id number]   MEDICAL RECORD NO.:  [id number]                   PATIENT TYPE:  EMS   LOCATION:  ED                                   FACILITY:  [facility]   PHYSICIAN:  Edward L. Juanetta Gosling, M.D.             DATE OF BIRTH:  May 05, 1956   DATE OF PROCEDURE:  11/17/2002  DATE OF DISCHARGE:                                EKG INTERPRETATION   DATE AND TIME OF TEST:  November 17, 2002 at 0734.   IMPRESSION:  The rhythm is sinus rhythm with a rate in the 80s.  Normal  electrocardiogram.                                               Oneal Deputy. Juanetta Gosling, M.D.    ELH/MEDQ  D:  11/17/2002  T:  11/17/2002  Job:  [id number]

## 2010-12-27 NOTE — H&P (Signed)
NAME:  Denise Hartman, Denise Hartman                 ACCOUNT NO.:  [id number]   MEDICAL RECORD NO.:  [id number]          PATIENT TYPE:  AMB   LOCATION:  DAY                           FACILITY:  [facility]   PHYSICIAN:  Tilda Burrow, M.D. DATE OF BIRTH:  [birth year]   DATE OF ADMISSION:  DATE OF DISCHARGE:  LH                              HISTORY & PHYSICAL   ADMISSION DIAGNOSIS:  Pelvic pain, suspected ovarian entrapment, left-  sided predominance.   HISTORY OF PRESENT ILLNESS:  This 55 year old year old female, a  longstanding patient of [facility] with no office appointments  in several years who was referred back to our office courtesy of Dr.  Patrica Duel for abdominal pain, left side, which is an abdominal ache  worse for the past three months, increased with activity, worse at the  end of the day with pain on intercourse, particularly on the left side.  Medical history is notable for total abdominal hysterectomy, [facility]  urethropexy Jan 06, 1994 with overall stable course since then, but with  progressive pelvic discomfort symptoms over time. She has had an  ultrasound which reveals a left ovarian cyst fairly close to the vaginal  cuff.  The right ovary is further away from the cuff and is, by  comparison, less uncomfortable, but after discussion of treatment  options, she is inclined to have both tubes and ovaries removed.  She  recognizes that hormone replacement issues will need to be addressed  with the decision either for or against hormone replacement based on  surgical __findings_.  She understands that this may need to be  converted to a laparotomy for problems of access, bleeding, or technical  challenges.  Risks of bowel and bladder, ureters are reviewed.  She  understands that additional procedures are sometimes necessary including  transfusions and we have had a line by line review of surgical consent.   PAST MEDICAL HISTORY:  1. Diabetes.  2. Hypertension.   PAST SURGICAL  HISTORY:  Arthroscopy, left knee x4.  [place] x3, [facility]  x1.  Hysterectomy   MEDICATIONS:  1. Benicar.  2. Actos.  3. Metformin 15/850.  4. Allegra-D 180 daily.  5. Xanax 0.5 mg p.r.n. three times daily.  6. Nexium.  7. Vicodin p.r.n. pain.   ALLERGIES:  None known.   PHYSICAL EXAMINATION:  VITAL SIGNS:  Weight 213, blood pressure 120/78,  height 5 feet 7 inches.  HEENT:  Pupils are equal, round and reactive.  NECK:  Supple. Trachea midline.  ABDOMEN:  Moderate obesity.  EXTERNAL GENITALIA:  Normal.  Vaginal length within normal limits.  Adequate support.  Stress incontinence denied.  Vaginal cuffs smoothly  healed but tender on the left side, particularly with vaginal probe or  bimanual exam.  EXTREMITIES:  Within normal limits. Multiple surgical scars around the  left knee.  The patient is able to walk with normal gait at this time.   PLAN:  Laparoscopic bilateral salpingo-oophorectomy, September 24, 2006.      Tilda Burrow, M.D.  Electronically Signed     JVF/MEDQ  D:  09/23/2006  T:  09/24/2006  Job:  [id number]   cc:   Patrica Duel, M.D.  Fax: [phone]

## 2010-12-27 NOTE — Op Note (Signed)
NAME:  Denise Hartman, Denise Hartman                           ACCOUNT NO.:  [id number]   MEDICAL RECORD NO.:  [id number]                   PATIENT TYPE:  AMB   LOCATION:  DAY                                  FACILITY:  [facility]   PHYSICIAN:  Barbaraann Barthel, M.D.              DATE OF BIRTH:  1956-02-03   DATE OF PROCEDURE:  12/16/2002  DATE OF DISCHARGE:                                 OPERATIVE REPORT   SURGEON:  Barbaraann Barthel, M.D.   PREOPERATIVE DIAGNOSIS:  Cholecystitis secondary to biliary dyskinesia.   POSTOPERATIVE DIAGNOSIS:  Cholecystitis secondary to biliary dyskinesia.   PROCEDURE:  Laparoscopic cholecystectomy.   SPECIMENS:  Gallbladder.   INDICATIONS FOR PROCEDURE:  This is a 55 year old white female who had  recurrent symptoms of right upper quadrant pain, nausea, and postprandial  discomfort with vomiting.  She was evaluated thoroughly by the GI service,  including a sonogram which did not reveal the presence of stones, a  hepatobiliary scan which was marginally normal with a low ejection fraction,  and had an upper GI endoscopy performed.  She was referred for laparoscopic  cholecystectomy for biliary dyskinesia.  Her symptoms certainly suggested  this, and she did not respond to any diet manipulation.  She also did not  respond to Nexium therapy either.   We discussed the need for laparoscopic cholecystectomy, discussing the  complications, not limited to but including bleeding, infection, damage to  bile ducts, perforation of organs, and transitory diarrhea.  We also  discussed that results of cholecystectomy for biliary dyskinesia were not as  satisfying as for cholecystectomy for cholelithiasis.  We discussed this in  detail, and informed consent was obtained.   GROSS OPERATIVE FINDINGS:  The patient had some adhesions around the right  upper quadrant which were taken down with cautery and with lysing these with  the scissors.  She had a small cystic duct which was not  cannulated.  No  stones were noted within the gallbladder.  A very distended gallbladder,  otherwise.  The liver appeared to have some fatty infiltration; otherwise,  the right upper quadrant appeared to be normal.   TECHNIQUE:  The patient was placed in the supine position and after the  adequate administration of general anesthesia via endotracheal intubation,  her entire abdomen was prepped with Betadine solution and draped in the  usual manner.  Prior to this, a Foley catheter was aseptically inserted.   A periumbilical incision was carried out over the superior aspect of the  umbilicus.  With the patient in Trendelenburg, the fascia was grasped with a  sharp towel clip, and a Veress needle was inserted and confirmed the  position with the saline drop test.  We then used the Visiport technique,  and an 11 [employer] cannula was placed in the umbilicus, and then under  direct vision, three other cannulas were placed, an 11-mm cannula in the  epigastrium and two 5-mm cannulas in the right upper quadrant laterally.  The gallbladder was grasped.  Its adhesions were taken down.  The cystic  duct was isolated, triply silver clipped, and divided as was the cystic  artery.  The gallbladder was then removed uneventfully from the liver bed.  The bleeding was controlled with the cautery device.  There was a small  amount of oozing.  I elected to leave Surgicel within the liver bed.  We  then irrigated and checked for hemostasis.  This was deemed to be complete.  We then desufflated the abdomen and then closed the fascia in the area of  the umbilicus with a 0 Polysorb and used 0.5% Sensorcaine to help with  postoperative comfort.  We then closed all other skin incisions with a  stapling device.  Sterile  OpSite dressings were placed.  Prior to closure,  all sponge, needle, and instrument counts were found to be correct.  Estimated blood loss was minimal.  The patient received 1200 cc of   crystalloid intraoperatively.  No drains were placed, and there were no  complications.                                               Barbaraann Barthel, M.D.    Jodi Marble  D:  12/16/2002  T:  12/16/2002  Job:  [id number]   cc:   Dionicia Abler, M.D.

## 2011-04-30 LAB — COMPREHENSIVE METABOLIC PANEL
ALT: 20
ALT: 22
ALT: 24
AST: 17
AST: 19
AST: 21
Albumin: 3.8
Albumin: 4.3
Alkaline Phosphatase: 45
Alkaline Phosphatase: 54
Alkaline Phosphatase: 58
BUN: 7
BUN: 9
CO2: 27
CO2: 28
CO2: 28
Calcium: 9.3
Calcium: 9.5
Chloride: 101
Chloride: 103
Creatinine, Ser: 0.79
Creatinine, Ser: 0.79
GFR calc Af Amer: >60
GFR calc Af Amer: >60
GFR calc Af Amer: >60
GFR calc non Af Amer: >60
GFR calc non Af Amer: >60
Glucose, Bld: 117 — ABNORMAL HIGH
Glucose, Bld: 134 — ABNORMAL HIGH
Glucose, Bld: 142 — ABNORMAL HIGH
Potassium: 3.4 — ABNORMAL LOW
Potassium: 3.9
Potassium: 4
Sodium: 135
Sodium: 138
Sodium: 138
Total Bilirubin: 1.3 — ABNORMAL HIGH
Total Bilirubin: 1.4 — ABNORMAL HIGH
Total Protein: 5.4 — ABNORMAL LOW
Total Protein: 6.7
Total Protein: 7.4

## 2011-04-30 LAB — POCT CARDIAC MARKERS
CKMB, poc: <1 — ABNORMAL LOW
Myoglobin, poc: 72.5
Operator id: 213671
Troponin i, poc: <0.05

## 2011-04-30 LAB — CBC
HCT: 41.1
HCT: 45.1
Hemoglobin: 11.6 — ABNORMAL LOW
Hemoglobin: 13.6
Hemoglobin: 14.9
MCHC: 33
MCHC: 33.2
MCV: 86.4
MCV: 86.6
Platelets: 258
Platelets: 296
RBC: 3.96
RBC: 4.75
RBC: 5.21 — ABNORMAL HIGH
RDW: 13.6
RDW: 14
WBC: 7
WBC: 8.2

## 2011-04-30 LAB — DIFFERENTIAL
Basophils Absolute: 0
Basophils Absolute: 0
Basophils Relative: 0
Basophils Relative: 1
Basophils Relative: 1
Eosinophils Absolute: 0.1
Eosinophils Absolute: 0.2
Eosinophils Absolute: 0.2
Eosinophils Relative: 1
Eosinophils Relative: 2
Eosinophils Relative: 2
Lymphocytes Relative: 48 — ABNORMAL HIGH
Lymphocytes Relative: 52 — ABNORMAL HIGH
Lymphs Abs: 3.6
Lymphs Abs: 3.9
Lymphs Abs: 4
Monocytes Absolute: 0.4
Monocytes Absolute: 0.5
Monocytes Relative: 6
Monocytes Relative: 6
Monocytes Relative: 6
Neutro Abs: 2.8
Neutro Abs: 3.6
Neutrophils Relative %: 39 — ABNORMAL LOW
Neutrophils Relative %: 44
Neutrophils Relative %: 45

## 2011-04-30 LAB — URINALYSIS, ROUTINE W REFLEX MICROSCOPIC
Bilirubin Urine: NEGATIVE
Glucose, UA: NEGATIVE
Hgb urine dipstick: NEGATIVE
Ketones, ur: NEGATIVE
Nitrite: NEGATIVE
Protein, ur: NEGATIVE
Specific Gravity, Urine: 1.01
Urobilinogen, UA: 0.2
pH: 6

## 2011-04-30 LAB — CARDIAC PANEL(CRET KIN+CKTOT+MB+TROPI)
CK, MB: 0.6
CK, MB: 0.9
Relative Index: INVALID
Relative Index: INVALID
Relative Index: INVALID
Total CK: 48
Total CK: 65
Troponin I: 0.03

## 2011-04-30 LAB — PROTIME-INR
INR: 0.9
Prothrombin Time: 12.8

## 2011-04-30 LAB — URINE CULTURE
Colony Count: NO GROWTH
Culture: NO GROWTH
Special Requests: NEGATIVE

## 2011-04-30 LAB — LIPASE, BLOOD: Lipase: 40

## 2011-04-30 LAB — PHOSPHORUS: Phosphorus: 2.7

## 2011-04-30 LAB — B-NATRIURETIC PEPTIDE (CONVERTED LAB): Pro B Natriuretic peptide (BNP): <30

## 2011-04-30 LAB — D-DIMER, QUANTITATIVE: D-Dimer, Quant: 0.49 — ABNORMAL HIGH

## 2011-04-30 LAB — LIPID PANEL
Cholesterol: 132
LDL Cholesterol: 59

## 2011-04-30 LAB — APTT: aPTT: 26

## 2011-04-30 LAB — TSH: TSH: 1.921

## 2011-04-30 LAB — MAGNESIUM: Magnesium: 2.1

## 2011-05-01 LAB — BASIC METABOLIC PANEL
BUN: 14
Calcium: 8.7
Creatinine, Ser: 0.89
GFR calc Af Amer: >60
GFR calc non Af Amer: >60

## 2011-05-01 LAB — POCT I-STAT 3, ART BLOOD GAS (G3+)
Acid-Base Excess: 1
Operator id: 246211
pO2, Arterial: 65 — ABNORMAL LOW

## 2011-05-01 LAB — CBC
Platelets: 254
RBC: 4.31
WBC: 15.7 — ABNORMAL HIGH

## 2011-05-01 LAB — SEDIMENTATION RATE: Sed Rate: 5

## 2011-05-01 LAB — POCT I-STAT 3, VENOUS BLOOD GAS (G3P V)
Acid-base deficit: 2
O2 Saturation: 71
pCO2, Ven: 40.7 — ABNORMAL LOW

## 2011-05-01 LAB — H. PYLORI ANTIBODY, IGG: H Pylori IgG: 0.5

## 2011-05-23 LAB — DIFFERENTIAL
Basophils Relative: 0
Eosinophils Absolute: 0.3
Lymphs Abs: 4.8 — ABNORMAL HIGH
Monocytes Relative: 6
Neutro Abs: 5.2
Neutrophils Relative %: 47

## 2011-05-23 LAB — POCT CARDIAC MARKERS
CKMB, poc: <1 — ABNORMAL LOW
Myoglobin, poc: 23.3
Myoglobin, poc: 29.1
Operator id: 189501
Operator id: 267301
Troponin i, poc: <0.05

## 2011-05-23 LAB — CBC
MCHC: 33.7
MCV: 86
Platelets: 276
RBC: 4.57
WBC: 11 — ABNORMAL HIGH

## 2011-05-23 LAB — BASIC METABOLIC PANEL
BUN: 9
Calcium: 10.3
Creatinine, Ser: 0.82
GFR calc Af Amer: >60

## 2011-05-28 LAB — CBC
HCT: 37.5
Hemoglobin: 12.9
MCHC: 34.3
MCV: 86.1
Platelets: 254
RBC: 4.36
RDW: 12.9
WBC: 10.8 — ABNORMAL HIGH

## 2011-05-28 LAB — COMPREHENSIVE METABOLIC PANEL
ALT: 30
AST: 31
Albumin: 3.7
Calcium: 9.1
Creatinine, Ser: 0.82
GFR calc Af Amer: >60
GFR calc non Af Amer: >60
Sodium: 138
Total Protein: 6.1

## 2011-05-28 LAB — COMPREHENSIVE METABOLIC PANEL WITH GFR
Alkaline Phosphatase: 58
BUN: 13
CO2: 24
Chloride: 106
Glucose, Bld: 98
Potassium: 4
Total Bilirubin: 1

## 2011-05-28 LAB — DIFFERENTIAL
Basophils Absolute: 0
Basophils Relative: 0
Eosinophils Absolute: 0.2
Eosinophils Relative: 2
Lymphocytes Relative: 37
Lymphs Abs: 4 — ABNORMAL HIGH
Monocytes Absolute: 0.6
Monocytes Relative: 5
Neutro Abs: 6
Neutrophils Relative %: 56

## 2011-09-21 ENCOUNTER — Emergency Department (HOSPITAL_COMMUNITY)
Admission: EM | Admit: 2011-09-21 | Discharge: 2011-09-21 | Disposition: A | Discharge status: Home / Self Care | Payer: 59 | Attending: Emergency Medicine | Admitting: Emergency Medicine

## 2011-09-21 ENCOUNTER — Encounter (HOSPITAL_COMMUNITY): Payer: Self-pay

## 2011-09-21 DIAGNOSIS — R05 Cough: Secondary | ICD-10-CM | POA: Insufficient documentation

## 2011-09-21 DIAGNOSIS — R059 Cough, unspecified: Secondary | ICD-10-CM | POA: Insufficient documentation

## 2011-09-21 DIAGNOSIS — R197 Diarrhea, unspecified: Secondary | ICD-10-CM | POA: Insufficient documentation

## 2011-09-21 DIAGNOSIS — R112 Nausea with vomiting, unspecified: Secondary | ICD-10-CM | POA: Insufficient documentation

## 2011-09-21 DIAGNOSIS — IMO0001 Reserved for inherently not codable concepts without codable children: Secondary | ICD-10-CM | POA: Insufficient documentation

## 2011-09-21 DIAGNOSIS — R109 Unspecified abdominal pain: Secondary | ICD-10-CM | POA: Insufficient documentation

## 2011-09-21 DIAGNOSIS — E119 Type 2 diabetes mellitus without complications: Secondary | ICD-10-CM | POA: Insufficient documentation

## 2011-09-21 DIAGNOSIS — Z79899 Other long term (current) drug therapy: Secondary | ICD-10-CM | POA: Insufficient documentation

## 2011-09-21 HISTORY — DX: Diverticulitis of intestine, part unspecified, without perforation or abscess without bleeding: K57.92

## 2011-09-21 LAB — URINE MICROSCOPIC-ADD ON

## 2011-09-21 LAB — URINALYSIS, ROUTINE W REFLEX MICROSCOPIC
Bilirubin Urine: NEGATIVE
Glucose, UA: NEGATIVE mg/dL
Nitrite: NEGATIVE
Specific Gravity, Urine: 1.025 (ref 1.005–1.030)
pH: 5.5 (ref 5.0–8.0)

## 2011-09-21 LAB — PREGNANCY, URINE: Preg Test, Ur: NEGATIVE

## 2011-09-21 MED ORDER — ONDANSETRON HCL 8 MG PO TABS: 8.0000 mg | ORAL_TABLET | Freq: Three times a day (TID) | ORAL | Status: AC | PRN

## 2011-09-21 MED ORDER — SODIUM CHLORIDE 0.9 % IV BOLUS (SEPSIS)
1000.0000 mL | Freq: Once | INTRAVENOUS | Status: AC
  Administered 2011-09-21: 1000 mL via INTRAVENOUS

## 2011-09-21 MED ORDER — ONDANSETRON HCL 4 MG/2ML IJ SOLN
4.0000 mg | Freq: Once | INTRAMUSCULAR | Status: AC
  Administered 2011-09-21: 4 mg via INTRAVENOUS
  Filled 2011-09-21: qty 2

## 2011-09-21 NOTE — ED Provider Notes (Signed)
History     CSN: [id number]  Arrival date & time 09/21/11  1149   First MD Initiated Contact with Patient 09/21/11 1456      Chief Complaint  Patient presents with  . Cough  . Generalized Body Aches  . Nausea  . Emesis  . Diarrhea    (Consider location/radiation/quality/duration/timing/severity/associated sxs/prior treatment) Patient is a 56 y.o. female presenting with cough, vomiting, and diarrhea. The history is provided by the patient.  Cough Pertinent negatives include no chest pain, no chills, no headaches, no sore throat, no shortness of breath and no eye redness.  Emesis  Associated symptoms include cough and diarrhea. Pertinent negatives include no chills, no fever and no headaches.  Diarrhea The primary symptoms include vomiting and diarrhea. Primary symptoms do not include fever, dysuria or rash.  The illness does not include chills or back pain.  pt c/o nvd x past 2 days. Several episodes of each. Diarrhea watery, not bloody or mucousy. Emesis clear, not bloody or bilious. On and off mid to lower abd cramping bil, no constant or focal abd pain. No fever or chills. +body aches. Today mild non productive cough. No cp or sob. No sore throat or other uri c/o. No headache. States was recently on abx, cipro, for uti. No current gu c/o. No flank pain.   Past Medical History  Diagnosis Date  . Diabetes mellitus   . Diverticulitis     Past Surgical History  Procedure Date  . Abdominal hysterectomy   . Tonsillectomy   . Knee surgery     No family history on file.  History  Substance Use Topics  . Smoking status: Never Smoker   . Smokeless tobacco: Not on file  . Alcohol Use: No    OB History    Grav Para Term Preterm Abortions TAB SAB Ect Mult Living                  Review of Systems  Constitutional: Negative for fever and chills.  HENT: Negative for sore throat and neck pain.   Eyes: Negative for redness.  Respiratory: Positive for cough. Negative for  shortness of breath.   Cardiovascular: Negative for chest pain.  Gastrointestinal: Positive for vomiting and diarrhea.  Genitourinary: Negative for dysuria and flank pain.  Musculoskeletal: Negative for back pain.  Skin: Negative for rash.  Neurological: Negative for headaches.  Hematological: Does not bruise/bleed easily.  Psychiatric/Behavioral: Negative for confusion.    Allergies  Shellfish allergy and Oxycodone  Home Medications   Current Outpatient Rx  Name Route Sig Dispense Refill  . ACETAMINOPHEN 500 MG PO TABS Oral Take 500 mg by mouth every 6 (six) hours as needed. For pain    . ARTIFICIAL TEAR OP Ophthalmic Apply 1 drop to eye as needed. For dry eyes    . CIPROFLOXACIN HCL 250 MG PO TABS Oral Take 250 mg by mouth 2 (two) times daily.    Marland Kitchen GLIPIZIDE ER 2.5 MG PO TB24 Oral Take 2.5 mg by mouth daily.    Marland Kitchen METFORMIN HCL 500 MG PO TABS Oral Take 500 mg by mouth 2 (two) times daily.      BP 127/59  Pulse 73  Temp(Src) 98.2 F (36.8 C) (Oral)  Resp 20  Ht 5\' 7"  (1.702 m)  Wt 201 lb (91.173 kg)  BMI 31.48 kg/m2  SpO2 100%  Physical Exam  Nursing note and vitals reviewed. Constitutional: She is oriented to person, place, and time. She appears well-developed. No  distress.  HENT:  Mouth/Throat: Oropharynx is clear and moist.  Eyes: Conjunctivae are normal. No scleral icterus.  Neck: Neck supple. No tracheal deviation present.  Cardiovascular: Normal rate, regular rhythm, normal heart sounds and intact distal pulses.  Exam reveals no gallop and no friction rub.   No murmur heard. Pulmonary/Chest: Effort normal and breath sounds normal. No respiratory distress.  Abdominal: Soft. Normal appearance and bowel sounds are normal. She exhibits no distension and no mass. There is no tenderness. There is no rebound and no guarding.  Genitourinary:       No cva tenderness  Musculoskeletal: She exhibits no edema.  Neurological: She is alert and oriented to person, place, and  time.  Skin: Skin is warm and dry. No rash noted.  Psychiatric: She has a normal mood and affect.    ED Course  Procedures (including critical care time)   Labs Reviewed  PREGNANCY, URINE  URINALYSIS, ROUTINE W REFLEX MICROSCOPIC    Results for orders placed during the hospital encounter of 09/21/11  PREGNANCY, URINE      Component Value Range   Preg Test, Ur NEGATIVE  NEGATIVE   URINALYSIS, ROUTINE W REFLEX MICROSCOPIC      Component Value Range   Color, Urine YELLOW  YELLOW    APPearance CLOUDY (*) CLEAR    Specific Gravity, Urine 1.025  1.005 - 1.030    pH 5.5  5.0 - 8.0    Glucose, UA NEGATIVE  NEGATIVE (mg/dL)   Hgb urine dipstick TRACE (*) NEGATIVE    Bilirubin Urine NEGATIVE  NEGATIVE    Ketones, ur NEGATIVE  NEGATIVE (mg/dL)   Protein, ur NEGATIVE  NEGATIVE (mg/dL)   Urobilinogen, UA 0.2  0.0 - 1.0 (mg/dL)   Nitrite NEGATIVE  NEGATIVE    Leukocytes, UA SMALL (*) NEGATIVE   URINE MICROSCOPIC-ADD ON      Component Value Range   Squamous Epithelial / LPF MANY (*) RARE    WBC, UA 7-10  <3 (WBC/hpf)   RBC / HPF 7-10  <3 (RBC/hpf)   Bacteria, UA MANY (*) RARE       MDM  Iv ns bolus. zofran iv.   Additional ivf. No vomiting or diarrhea in ed. abd soft nt. Discussed ua, as still on cipro, and ?contam spec vs true uti, will culture.         Suzi Roots, MD 09/21/11 [phone]

## 2011-09-21 NOTE — ED Notes (Signed)
Patient ambulatory to restroom with steady gait.

## 2011-09-21 NOTE — ED Notes (Signed)
Pt presents with cough, n/v/d, and body aches since Wednesday.

## 2011-09-21 NOTE — ED Notes (Signed)
Patient with no complaints at this time. Respirations even and unlabored. Skin warm/dry. Discharge instructions reviewed with patient at this time. Patient given opportunity to voice concerns/ask questions. IV removed per policy and band-aid applied to site. Patient discharged at this time and left Emergency Department with steady gait.  

## 2011-09-22 LAB — URINE CULTURE

## 2012-06-21 ENCOUNTER — Encounter (HOSPITAL_COMMUNITY): Payer: Self-pay

## 2012-06-21 ENCOUNTER — Emergency Department (HOSPITAL_COMMUNITY)
Admission: EM | Admit: 2012-06-21 | Discharge: 2012-06-21 | Disposition: A | Discharge status: Home / Self Care | Payer: 59 | Source: Home / Self Care

## 2012-06-21 DIAGNOSIS — R071 Chest pain on breathing: Secondary | ICD-10-CM

## 2012-06-21 DIAGNOSIS — J40 Bronchitis, not specified as acute or chronic: Secondary | ICD-10-CM

## 2012-06-21 MED ORDER — CETIRIZINE HCL 10 MG PO CHEW: 10.0000 mg | CHEWABLE_TABLET | Freq: Every day | ORAL | Status: DC

## 2012-06-21 MED ORDER — ALBUTEROL SULFATE HFA 108 (90 BASE) MCG/ACT IN AERS: 2.0000 | INHALATION_SPRAY | RESPIRATORY_TRACT | Status: DC | PRN

## 2012-06-21 MED ORDER — METHYLPREDNISOLONE 4 MG PO KIT: PACK | ORAL | Status: DC

## 2012-06-21 NOTE — ED Provider Notes (Signed)
Medical screening examination/treatment/procedure(s) were performed by resident physician or non-physician practitioner and as supervising physician I was immediately available for consultation/collaboration.   Zondra Lawlor DOUGLAS MD.    Stuart Guillen D Marciel Offenberger, MD 06/21/12 2045 

## 2012-06-21 NOTE — ED Provider Notes (Signed)
History     CSN: [id number]  Arrival date & time 06/21/12  1602   None     Chief Complaint  Patient presents with  . Cough    (Consider location/radiation/quality/duration/timing/severity/associated sxs/prior treatment) Patient is a 56 y.o. female presenting with cough. The history is provided by the patient.  Cough This is a recurrent problem. The problem occurs every few minutes. The problem has been gradually worsening. The cough is non-productive. There has been no fever. Associated symptoms include chest pain, chills and sore throat. She has tried cough syrup for the symptoms. The treatment provided mild relief. She is not a smoker (spouse smokes). Her past medical history is significant for bronchitis and pneumonia. Her past medical history does not include asthma.  prescribed zpack 3 weeks ago by pcp, states cough remains not relieved by cough syrup.  Past Medical History  Diagnosis Date  . Diabetes mellitus   . Diverticulitis     Past Surgical History  Procedure Date  . Abdominal hysterectomy   . Tonsillectomy   . Knee surgery     No family history on file.  History  Substance Use Topics  . Smoking status: Never Smoker   . Smokeless tobacco: Not on file  . Alcohol Use: No    OB History    Grav Para Term Preterm Abortions TAB SAB Ect Mult Living                  Review of Systems  Constitutional: Positive for chills.  HENT: Positive for sore throat.   Respiratory: Positive for cough.   Cardiovascular: Positive for chest pain.  All other systems reviewed and are negative.    Allergies  Shellfish allergy and Oxycodone  Home Medications   Current Outpatient Rx  Name  Route  Sig  Dispense  Refill  . ACETAMINOPHEN 500 MG PO TABS   Oral   Take 500 mg by mouth every 6 (six) hours as needed. For pain         . ARTIFICIAL TEAR OP   Ophthalmic   Apply 1 drop to eye as needed. For dry eyes         . CITALOPRAM HYDROBROMIDE 40 MG PO TABS    Oral   Take 40 mg by mouth daily.         . GLYBURIDE 5 MG PO TABS   Oral   Take 5 mg by mouth daily.         Marland Kitchen METFORMIN HCL 500 MG PO TABS   Oral   Take 500 mg by mouth 2 (two) times daily.         . ALBUTEROL SULFATE HFA 108 (90 BASE) MCG/ACT IN AERS   Inhalation   Inhale 2 puffs into the lungs every 4 (four) hours as needed for wheezing.   1 Inhaler   2   . CETIRIZINE HCL 10 MG PO CHEW   Oral   Chew 1 tablet (10 mg total) by mouth daily.   30 tablet   0   . CIPROFLOXACIN HCL 250 MG PO TABS   Oral   Take 250 mg by mouth 2 (two) times daily.         Marland Kitchen GLIPIZIDE ER 2.5 MG PO TB24   Oral   Take 2.5 mg by mouth daily.         . METHYLPREDNISOLONE 4 MG PO KIT      follow package directions   21 tablet   0  BP 149/71  Pulse 65  Temp 98 F (36.7 C) (Oral)  Resp 16  SpO2 100%  Physical Exam  Nursing note and vitals reviewed. Constitutional: She is oriented to person, place, and time. Vital signs are normal. She appears well-developed and well-nourished. She is active and cooperative.  HENT:  Head: Normocephalic.  Right Ear: Hearing, tympanic membrane, external ear and ear canal normal.  Left Ear: Hearing, tympanic membrane, external ear and ear canal normal.  Nose: Right sinus exhibits no maxillary sinus tenderness and no frontal sinus tenderness. Left sinus exhibits no maxillary sinus tenderness and no frontal sinus tenderness.  Mouth/Throat: Uvula is midline, oropharynx is clear and moist and mucous membranes are normal.  Eyes: Conjunctivae normal are normal. Pupils are equal, round, and reactive to light. No scleral icterus.  Neck: Trachea normal. Neck supple.  Cardiovascular: Normal rate and regular rhythm.   Pulmonary/Chest: Effort normal and breath sounds normal.  Lymphadenopathy:       Head (right side): No submental, no submandibular, no tonsillar, no preauricular, no posterior auricular and no occipital adenopathy present.       Head (left  side): No submental, no submandibular, no tonsillar, no preauricular, no posterior auricular and no occipital adenopathy present.    She has no cervical adenopathy.  Neurological: She is alert and oriented to person, place, and time. No cranial nerve deficit or sensory deficit.  Skin: Skin is warm and dry.  Psychiatric: She has a normal mood and affect. Her speech is normal and behavior is normal. Judgment and thought content normal. Cognition and memory are normal.    ED Course  Procedures (including critical care time)  Labs Reviewed - No data to display No results found.   1. Bronchitis   2. Costochondral chest pain       MDM  Take medications as prescribed.        Johnsie Kindred, NP 06/21/12 1912

## 2012-06-21 NOTE — ED Notes (Signed)
C/o cough x 3 weeks, left side rib pain, states PCP Loran Senters prescribed a zpack approx 3 weeks ago and she is no better, states that the coughing is worse at night

## 2012-11-15 ENCOUNTER — Encounter (HOSPITAL_COMMUNITY): Payer: Self-pay

## 2012-11-15 ENCOUNTER — Emergency Department (HOSPITAL_COMMUNITY)
Admission: EM | Admit: 2012-11-15 | Discharge: 2012-11-16 | Disposition: A | Discharge status: Home / Self Care | Payer: 59 | Attending: Emergency Medicine | Admitting: Emergency Medicine

## 2012-11-15 DIAGNOSIS — J069 Acute upper respiratory infection, unspecified: Secondary | ICD-10-CM | POA: Insufficient documentation

## 2012-11-15 DIAGNOSIS — J329 Chronic sinusitis, unspecified: Secondary | ICD-10-CM

## 2012-11-15 DIAGNOSIS — J019 Acute sinusitis, unspecified: Secondary | ICD-10-CM | POA: Insufficient documentation

## 2012-11-15 DIAGNOSIS — R51 Headache: Secondary | ICD-10-CM | POA: Insufficient documentation

## 2012-11-15 DIAGNOSIS — Z79899 Other long term (current) drug therapy: Secondary | ICD-10-CM | POA: Insufficient documentation

## 2012-11-15 DIAGNOSIS — K5732 Diverticulitis of large intestine without perforation or abscess without bleeding: Secondary | ICD-10-CM | POA: Insufficient documentation

## 2012-11-15 DIAGNOSIS — R0982 Postnasal drip: Secondary | ICD-10-CM | POA: Insufficient documentation

## 2012-11-15 DIAGNOSIS — E119 Type 2 diabetes mellitus without complications: Secondary | ICD-10-CM | POA: Insufficient documentation

## 2012-11-15 MED ORDER — FLUCONAZOLE 100 MG PO TABS
200.0000 mg | ORAL_TABLET | Freq: Every day | ORAL | Status: DC
  Administered 2012-11-15: 200 mg via ORAL
  Filled 2012-11-15: qty 2

## 2012-11-15 MED ORDER — PSEUDOEPHEDRINE HCL 60 MG PO TABS
60.0000 mg | ORAL_TABLET | Freq: Once | ORAL | Status: AC
  Administered 2012-11-15: 60 mg via ORAL
  Filled 2012-11-15: qty 1

## 2012-11-15 MED ORDER — PSEUDOEPHEDRINE HCL 60 MG PO TABS: 60.0000 mg | ORAL_TABLET | Freq: Four times a day (QID) | ORAL | Status: DC | PRN

## 2012-11-15 MED ORDER — METHYLPREDNISOLONE 4 MG PO KIT: PACK | ORAL | Status: DC

## 2012-11-15 MED ORDER — AMOXICILLIN 500 MG PO CAPS: 500.0000 mg | ORAL_CAPSULE | Freq: Three times a day (TID) | ORAL | Status: DC

## 2012-11-15 MED ORDER — PENICILLIN V POTASSIUM 250 MG PO TABS
500.0000 mg | ORAL_TABLET | Freq: Once | ORAL | Status: AC
  Administered 2012-11-15: 500 mg via ORAL
  Filled 2012-11-15: qty 2

## 2012-11-15 NOTE — ED Notes (Signed)
C/o head congestion since Saturday, low grade fever in triage

## 2012-11-15 NOTE — ED Notes (Signed)
Head congestion, "my eyeballs even hurt"  Also 2 "red bumps" under right axilla

## 2012-11-15 NOTE — ED Provider Notes (Signed)
History     CSN: [id number]  Arrival date & time 11/15/12  2142   First MD Initiated Contact with Patient 11/15/12 2241      Chief Complaint  Patient presents with  . URI    (Consider location/radiation/quality/duration/timing/severity/associated sxs/prior treatment) Patient is a 57 y.o. female presenting with URI. The history is provided by the patient.  URI Presenting symptoms: congestion   Presenting symptoms: no cough   Severity:  Moderate Duration:  2 days Timing:  Constant Progression:  Worsening Chronicity:  New Relieved by:  Nothing Worsened by:  Nothing tried Ineffective treatments:  OTC medications Associated symptoms: arthralgias, headaches, sinus pain and sneezing   Associated symptoms: no neck pain, no swollen glands and no wheezing   Associated symptoms comment:  Fever Risk factors: diabetes mellitus and sick contacts   Risk factors: no chronic cardiac disease and no chronic kidney disease     Past Medical History  Diagnosis Date  . Diabetes mellitus   . Diverticulitis     Past Surgical History  Procedure Laterality Date  . Abdominal hysterectomy    . Tonsillectomy    . Knee surgery      No family history on file.  History  Substance Use Topics  . Smoking status: Never Smoker   . Smokeless tobacco: Not on file  . Alcohol Use: No    OB History   Grav Para Term Preterm Abortions TAB SAB Ect Mult Living                  Review of Systems  Constitutional: Negative for activity change.       All ROS Neg except as noted in HPI  HENT: Positive for congestion, sneezing and postnasal drip. Negative for nosebleeds and neck pain.   Eyes: Negative for photophobia and discharge.  Respiratory: Negative for cough, shortness of breath and wheezing.   Cardiovascular: Negative for chest pain and palpitations.  Gastrointestinal: Negative for abdominal pain and blood in stool.  Genitourinary: Negative for dysuria, frequency and hematuria.   Musculoskeletal: Positive for arthralgias. Negative for back pain.  Skin: Negative.   Neurological: Positive for headaches. Negative for dizziness, seizures and speech difficulty.  Psychiatric/Behavioral: Negative for hallucinations and confusion.    Allergies  Shellfish allergy and Oxycodone  Home Medications   Current Outpatient Rx  Name  Route  Sig  Dispense  Refill  . acetaminophen (TYLENOL) 500 MG tablet   Oral   Take 500 mg by mouth every 6 (six) hours as needed. For pain         . albuterol (PROVENTIL HFA;VENTOLIN HFA) 108 (90 BASE) MCG/ACT inhaler   Inhalation   Inhale 2 puffs into the lungs every 4 (four) hours as needed for wheezing.   1 Inhaler   2   . ARTIFICIAL TEAR OP   Ophthalmic   Apply 1 drop to eye as needed. For dry eyes         . cetirizine (ZYRTEC) 10 MG chewable tablet   Oral   Chew 1 tablet (10 mg total) by mouth daily.   30 tablet   0   . ciprofloxacin (CIPRO) 250 MG tablet   Oral   Take 250 mg by mouth 2 (two) times daily.         . citalopram (CELEXA) 40 MG tablet   Oral   Take 40 mg by mouth daily.         Marland Kitchen glipiZIDE (GLUCOTROL XL) 2.5 MG 24 hr tablet  Oral   Take 2.5 mg by mouth daily.         Marland Kitchen glyBURIDE (DIABETA) 5 MG tablet   Oral   Take 5 mg by mouth daily.         . metFORMIN (GLUCOPHAGE) 500 MG tablet   Oral   Take 500 mg by mouth 2 (two) times daily.         . methylPREDNISolone (MEDROL DOSEPAK) 4 MG tablet      follow package directions   21 tablet   0     BP 122/89  Pulse 95  Temp(Src) 99.6 F (37.6 C) (Oral)  Ht 5\' 7"  (1.702 m)  Wt 205 lb (92.987 kg)  BMI 32.1 kg/m2  SpO2 98%  Physical Exam  Nursing note and vitals reviewed. Constitutional: She is oriented to person, place, and time. She appears well-developed and well-nourished.  Non-toxic appearance.  HENT:  Head: Normocephalic.  Right Ear: Tympanic membrane and external ear normal.  Left Ear: Tympanic membrane and external ear  normal.  Nasal congestion.  Eyes: EOM and lids are normal. Pupils are equal, round, and reactive to light.  Neck: Normal range of motion. Neck supple. Carotid bruit is not present.  Cardiovascular: Normal rate, regular rhythm, normal heart sounds, intact distal pulses and normal pulses.   Pulmonary/Chest: Breath sounds normal. No respiratory distress.  Abdominal: Soft. Bowel sounds are normal. There is no tenderness. There is no guarding.  Musculoskeletal: Normal range of motion.  Lymphadenopathy:       Head (right side): No submandibular adenopathy present.       Head (left side): No submandibular adenopathy present.    She has no cervical adenopathy.  Neurological: She is alert and oriented to person, place, and time. She has normal strength. No cranial nerve deficit or sensory deficit.  Skin: Skin is warm and dry.  Psychiatric: She has a normal mood and affect. Her speech is normal.    ED Course  Procedures (including critical care time)  Labs Reviewed - No data to display No results found.   No diagnosis found.    MDM  I have reviewed nursing notes, vital signs, and all appropriate lab and imaging results for this patient. Pt has a hx of sinus related problems. She reports congestion with headache. No nose bleed. No hot areas over the sinuses. Pain to percussion of the sinuses. Plan- Rx for amoxil, medrol dose pack and sudafed. Pt to monitor glucose closely and see PCP for additional evaluation.       Kathie Dike, PA-C 11/17/12 1027

## 2012-11-19 NOTE — ED Provider Notes (Signed)
Medical screening examination/treatment/procedure(s) were performed by non-physician practitioner and as supervising physician I was immediately available for consultation/collaboration.   Joya Gaskins, MD 11/19/12 [phone]

## 2012-11-30 ENCOUNTER — Encounter: Payer: Self-pay | Admitting: *Deleted

## 2012-12-01 ENCOUNTER — Encounter: Payer: Self-pay | Admitting: Family Medicine

## 2012-12-01 ENCOUNTER — Ambulatory Visit (INDEPENDENT_AMBULATORY_CARE_PROVIDER_SITE_OTHER): Payer: 59 | Admitting: Family Medicine

## 2012-12-01 VITALS — BP 129/78 | Temp 98.7°F | Wt 198.4 lb

## 2012-12-01 DIAGNOSIS — R300 Dysuria: Secondary | ICD-10-CM

## 2012-12-01 LAB — POCT URINALYSIS DIPSTICK
Spec Grav, UA: <=1.005
pH, UA: 7

## 2012-12-01 MED ORDER — TRIAMCINOLONE ACETONIDE 0.1 % EX CREA: 1.0000 "application " | TOPICAL_CREAM | Freq: Two times a day (BID) | CUTANEOUS | Status: DC

## 2012-12-01 MED ORDER — PREDNISONE 20 MG PO TABS: ORAL_TABLET | ORAL | Status: DC

## 2012-12-01 MED ORDER — FLUCONAZOLE 150 MG PO TABS: 150.0000 mg | ORAL_TABLET | Freq: Once | ORAL | Status: DC

## 2012-12-01 NOTE — Progress Notes (Signed)
  Subjective:    Patient ID: Denise Hartman, female    DOB: Jun 15, 1956, 57 y.o.   MRN: [id number]  Rash This is a new problem. The current episode started more than 1 month ago. The problem has been gradually worsening since onset. The affected locations include the abdomen, left arm, right arm and right hand. The rash is characterized by itchiness and dryness. She was exposed to nothing. Past treatments include antihistamine, anti-itch cream and moisturizer. The treatment provided no relief. Her past medical history is significant for allergies and eczema.  Dysuria    Increased urinary freq. Last wk. Hx of bladder infkn   Review of Systems  Genitourinary: Positive for dysuria.  Skin: Positive for rash.       Objective:   Physical Exam  Alert no acute distress. Vitals reviewed. HEENT normal. Lungs clear. Heart regular in rhythm. No CVA tenderness. Impression rash on extremities. Maculopapular in nature. No urticarial component.      Assessment & Plan:  Impression #1 flare of eczema. #2 allergic rhinitis. #3 dysuria with yeast present on urinalysis. Plan Diflucan. Prednisone taper. Triamcinolone cream twice a day to affected area. Symptomatic care discussed. WSL

## 2013-02-04 ENCOUNTER — Ambulatory Visit (INDEPENDENT_AMBULATORY_CARE_PROVIDER_SITE_OTHER): Payer: 59 | Admitting: Family Medicine

## 2013-02-04 ENCOUNTER — Encounter: Payer: Self-pay | Admitting: Family Medicine

## 2013-02-04 VITALS — BP 111/70 | Temp 98.4°F | Wt 192.8 lb

## 2013-02-04 DIAGNOSIS — R21 Rash and other nonspecific skin eruption: Secondary | ICD-10-CM

## 2013-02-04 MED ORDER — HYDRALAZINE HCL 25 MG PO TABS: ORAL_TABLET | ORAL | Status: DC

## 2013-02-04 MED ORDER — TERBINAFINE HCL 250 MG PO TABS: 250.0000 mg | ORAL_TABLET | Freq: Every day | ORAL | Status: DC

## 2013-02-04 MED ORDER — ONDANSETRON 4 MG PO TBDP: 4.0000 mg | ORAL_TABLET | Freq: Three times a day (TID) | ORAL | Status: DC | PRN

## 2013-02-04 NOTE — Progress Notes (Signed)
  Subjective:    Patient ID: Denise Hartman, female    DOB: Mar 20, 1956, 57 y.o.   MRN: [id number]  Rash This is a chronic problem. The current episode started more than 1 month ago. The problem has been gradually worsening since onset. She was exposed to nothing. Associated symptoms include vomiting. The treatment provided mild relief.  Emesis    Rash worse the past few days. Very pruritic. Now spreading  Vom multi times yesterday. Little dirrhea no fever   Review of Systems  Gastrointestinal: Positive for vomiting.  Skin: Positive for rash.   ROS otherwise negative.    Objective:   Physical Exam  Alert no acute distress. Lungs clear. Heart regular in rhythm H&T normal. Abdominal exam benign. Lateral chest wall tenia corporis-like lesions. Small papules elsewhere.      Assessment & Plan:  Impression 1 gastroenteritis discussed. #2 rash with potential fungal element discussed. Plan trial of Lamisil daily for month. Warning signs discussed. Zofran when necessary for nausea. If persists may well need to see dermatologist. WSL followup for diabetes visit as scheduled.

## 2013-02-06 DIAGNOSIS — R21 Rash and other nonspecific skin eruption: Secondary | ICD-10-CM | POA: Insufficient documentation

## 2013-02-07 ENCOUNTER — Ambulatory Visit: Payer: 59 | Admitting: Family Medicine

## 2013-03-07 ENCOUNTER — Ambulatory Visit: Payer: 59 | Admitting: Family Medicine

## 2013-11-16 ENCOUNTER — Other Ambulatory Visit: Payer: Self-pay | Admitting: *Deleted

## 2013-11-16 MED ORDER — CITALOPRAM HYDROBROMIDE 40 MG PO TABS: 40.0000 mg | ORAL_TABLET | Freq: Every day | ORAL | Status: DC

## 2013-12-12 ENCOUNTER — Ambulatory Visit: Payer: 59 | Admitting: Family Medicine

## 2013-12-26 ENCOUNTER — Ambulatory Visit: Payer: 59 | Admitting: Family Medicine

## 2018-03-24 DIAGNOSIS — F418 Other specified anxiety disorders: Secondary | ICD-10-CM | POA: Insufficient documentation

## 2018-03-24 DIAGNOSIS — E1142 Type 2 diabetes mellitus with diabetic polyneuropathy: Secondary | ICD-10-CM | POA: Insufficient documentation

## 2019-04-26 DIAGNOSIS — E663 Overweight: Secondary | ICD-10-CM | POA: Insufficient documentation

## 2019-04-26 DIAGNOSIS — Z6828 Body mass index (BMI) 28.0-28.9, adult: Secondary | ICD-10-CM | POA: Insufficient documentation

## 2019-09-15 ENCOUNTER — Encounter: Payer: Self-pay | Admitting: Family Medicine

## 2020-02-09 DIAGNOSIS — E782 Mixed hyperlipidemia: Secondary | ICD-10-CM | POA: Insufficient documentation

## 2020-11-19 DIAGNOSIS — K219 Gastro-esophageal reflux disease without esophagitis: Secondary | ICD-10-CM | POA: Insufficient documentation

## 2020-12-26 DIAGNOSIS — I10 Essential (primary) hypertension: Secondary | ICD-10-CM | POA: Insufficient documentation

## 2021-11-13 ENCOUNTER — Emergency Department (HOSPITAL_COMMUNITY): Payer: 59

## 2021-11-13 ENCOUNTER — Other Ambulatory Visit: Payer: Self-pay

## 2021-11-13 ENCOUNTER — Encounter (HOSPITAL_COMMUNITY): Payer: Self-pay | Admitting: Emergency Medicine

## 2021-11-13 ENCOUNTER — Emergency Department (HOSPITAL_COMMUNITY)
Admission: EM | Admit: 2021-11-13 | Discharge: 2021-11-13 | Disposition: A | Discharge status: Home / Self Care | Payer: 59 | Attending: Emergency Medicine | Admitting: Emergency Medicine

## 2021-11-13 DIAGNOSIS — Z79899 Other long term (current) drug therapy: Secondary | ICD-10-CM | POA: Insufficient documentation

## 2021-11-13 DIAGNOSIS — Z20822 Contact with and (suspected) exposure to covid-19: Secondary | ICD-10-CM | POA: Insufficient documentation

## 2021-11-13 DIAGNOSIS — E1165 Type 2 diabetes mellitus with hyperglycemia: Secondary | ICD-10-CM | POA: Diagnosis not present

## 2021-11-13 DIAGNOSIS — R197 Diarrhea, unspecified: Secondary | ICD-10-CM | POA: Diagnosis not present

## 2021-11-13 DIAGNOSIS — Z7984 Long term (current) use of oral hypoglycemic drugs: Secondary | ICD-10-CM | POA: Diagnosis not present

## 2021-11-13 DIAGNOSIS — R112 Nausea with vomiting, unspecified: Secondary | ICD-10-CM | POA: Insufficient documentation

## 2021-11-13 DIAGNOSIS — I10 Essential (primary) hypertension: Secondary | ICD-10-CM | POA: Insufficient documentation

## 2021-11-13 LAB — RESP PANEL BY RT-PCR (FLU A&B, COVID) ARPGX2
Influenza A by PCR: NEGATIVE
Influenza B by PCR: NEGATIVE
SARS Coronavirus 2 by RT PCR: NEGATIVE

## 2021-11-13 LAB — CBC
HCT: 41.3 % (ref 36.0–46.0)
Hemoglobin: 13.5 g/dL (ref 12.0–15.0)
MCH: 29.1 pg (ref 26.0–34.0)
MCHC: 32.7 g/dL (ref 30.0–36.0)
MCV: 89 fL (ref 80.0–100.0)
Platelets: 235 10*3/uL (ref 150–400)
RBC: 4.64 MIL/uL (ref 3.87–5.11)
RDW: 14.3 % (ref 11.5–15.5)
WBC: 6.6 10*3/uL (ref 4.0–10.5)
nRBC: 0 % (ref 0.0–0.2)

## 2021-11-13 LAB — COMPREHENSIVE METABOLIC PANEL
ALT: 24 U/L (ref 0–44)
AST: 36 U/L (ref 15–41)
Albumin: 4 g/dL (ref 3.5–5.0)
Alkaline Phosphatase: 71 U/L (ref 38–126)
Anion gap: 10 (ref 5–15)
BUN: 17 mg/dL (ref 8–23)
CO2: 26 mmol/L (ref 22–32)
Calcium: 10 mg/dL (ref 8.9–10.3)
Chloride: 100 mmol/L (ref 98–111)
Creatinine, Ser: 0.74 mg/dL (ref 0.44–1.00)
GFR, Estimated: >60 mL/min (ref >60)
Glucose, Bld: 193 mg/dL — ABNORMAL HIGH (ref 70–99)
Potassium: 3.7 mmol/L (ref 3.5–5.1)
Sodium: 136 mmol/L (ref 135–145)
Total Bilirubin: 1.1 mg/dL (ref 0.3–1.2)
Total Protein: 7.7 g/dL (ref 6.5–8.1)

## 2021-11-13 LAB — LIPASE, BLOOD: Lipase: 51 U/L (ref 11–51)

## 2021-11-13 MED ORDER — SODIUM CHLORIDE 0.9 % IV SOLN
1000.0000 mL | INTRAVENOUS | Status: DC
  Administered 2021-11-13: 1000 mL via INTRAVENOUS

## 2021-11-13 MED ORDER — SODIUM CHLORIDE 0.9 % IV BOLUS (SEPSIS)
1000.0000 mL | Freq: Once | INTRAVENOUS | Status: AC
  Administered 2021-11-13: 1000 mL via INTRAVENOUS

## 2021-11-13 MED ORDER — ONDANSETRON HCL 4 MG/2ML IJ SOLN
4.0000 mg | Freq: Once | INTRAMUSCULAR | Status: AC
  Administered 2021-11-13: 4 mg via INTRAVENOUS
  Filled 2021-11-13: qty 2

## 2021-11-13 MED ORDER — ONDANSETRON 8 MG PO TBDP: 8.0000 mg | ORAL_TABLET | Freq: Three times a day (TID) | ORAL | 0 refills | Status: DC | PRN

## 2021-11-13 NOTE — ED Notes (Addendum)
Na

## 2021-11-13 NOTE — Discharge Instructions (Signed)
Take the medications as needed for nausea and vomiting.  Make sure to drink plenty of fluids.  The symptoms should resolve within the next couple of days.  Return to the ER for fevers chills worsening symptoms ?

## 2021-11-13 NOTE — ED Provider Notes (Signed)
?[place] ?Provider Note ? ? ?CSN: [id number] ?Arrival date & time: 11/13/21  1922 ? ?  ? ?History ? ?Chief Complaint  ?Patient presents with  ? Emesis  ? ? ?Denise Hartman is a 66 y.o. female. ? ? ?Emesis ?Associated symptoms: no fever   ? ?Patient has history of diabetes, diverticulitis and hypertension.  Patient presents ED with complaints of nausea vomiting since Monday.  Patient states she mostly has had nausea and vomiting.  No significant diarrhea.  She has had some abdominal cramping as well.  Primarily gets the vomiting she is not able to keep anything down.  Patient denies any fevers.  She has been having some issues with coughing as well and that started before the nausea and vomiting. ? ?Home Medications ?Prior to Admission medications   ?Medication Sig Start Date End Date Taking? Authorizing Provider  ?ondansetron (ZOFRAN-ODT) 8 MG disintegrating tablet Take 1 tablet (8 mg total) by mouth every 8 (eight) hours as needed for nausea or vomiting. 11/13/21  Yes Dorie Rank, MD  ?acetaminophen (TYLENOL) 500 MG tablet Take 500 mg by mouth every 6 (six) hours as needed. For pain    [provider]  ?ARTIFICIAL TEAR OP Apply 1 drop to eye as needed. For dry eyes    [provider]  ?cetirizine (ZYRTEC) 10 MG chewable tablet Chew 1 tablet (10 mg total) by mouth daily. 06/21/12   Chatten, Lolita Cram, NP  ?citalopram (CELEXA) 40 MG tablet Take 1 tablet (40 mg total) by mouth daily. 11/16/13   Mikey Kirschner, MD  ?glipiZIDE (GLUCOTROL XL) 2.5 MG 24 hr tablet Take 2.5 mg by mouth daily.    [provider]  ?hydrALAZINE (APRESOLINE) 25 MG tablet Take 1 Q 4-6 hrs PRN for itching 02/04/13 04/06/13  Mikey Kirschner, MD  ?metFORMIN (GLUCOPHAGE) 500 MG tablet Take 500 mg by mouth 2 (two) times daily.    [provider]  ?pseudoephedrine (SUDAFED) 60 MG tablet Take 1 tablet (60 mg total) by mouth every 6 (six) hours as needed for congestion. 11/15/12   Lily Kocher, PA-C   ?terbinafine (LAMISIL) 250 MG tablet Take 1 tablet (250 mg total) by mouth daily. 02/04/13   Mikey Kirschner, MD  ?triamcinolone cream (KENALOG) 0.1 % Apply 1 application topically 2 (two) times daily. 12/01/12   Mikey Kirschner, MD  ?   ? ?Allergies    ?Shellfish allergy, Oxycodone, and Sulfa antibiotics   ? ?Review of Systems   ?Review of Systems  ?Constitutional:  Negative for fever.  ?Gastrointestinal:  Positive for vomiting.  ? ?Physical Exam ?Updated Vital Signs ?BP 129/62   Pulse 83   Temp 98.1 ?F (36.7 ?C) (Oral)   Resp 18   Ht 1.702 m (5\' 7" )   Wt 83.9 kg   SpO2 94%   BMI 28.98 kg/m?  ?Physical Exam ?Vitals and nursing note reviewed.  ?Constitutional:   ?   General: She is not in acute distress. ?   Appearance: She is well-developed.  ?HENT:  ?   Head: Normocephalic and atraumatic.  ?   Right Ear: External ear normal.  ?   Left Ear: External ear normal.  ?Eyes:  ?   General: No scleral icterus.    ?   Right eye: No discharge.     ?   Left eye: No discharge.  ?   Conjunctiva/sclera: Conjunctivae normal.  ?Neck:  ?   Trachea: No tracheal deviation.  ?Cardiovascular:  ?  Rate and Rhythm: Normal rate and regular rhythm.  ?Pulmonary:  ?   Effort: Pulmonary effort is normal. No respiratory distress.  ?   Breath sounds: Normal breath sounds. No stridor. No wheezing or rales.  ?Abdominal:  ?   General: Bowel sounds are normal. There is no distension.  ?   Palpations: Abdomen is soft.  ?   Tenderness: There is no abdominal tenderness. There is no guarding or rebound.  ?Musculoskeletal:     ?   General: No tenderness or deformity.  ?   Cervical back: Neck supple.  ?Skin: ?   General: Skin is warm and dry.  ?   Findings: No rash.  ?Neurological:  ?   General: No focal deficit present.  ?   Mental Status: She is alert.  ?   Cranial Nerves: No cranial nerve deficit (no facial droop, extraocular movements intact, no slurred speech).  ?   Sensory: No sensory deficit.  ?   Motor: No abnormal muscle tone or  seizure activity.  ?   Coordination: Coordination normal.  ?Psychiatric:     ?   Mood and Affect: Mood normal.  ? ? ?ED Results / Procedures / Treatments   ?Labs ?(all labs ordered are listed, but only abnormal results are displayed) ?Labs Reviewed  ?COMPREHENSIVE METABOLIC PANEL - Abnormal; Notable for the following components:  ?    Result Value  ? Glucose, Bld 193 (*)   ? All other components within normal limits  ?RESP PANEL BY RT-PCR (FLU A&B, COVID) ARPGX2  ?CBC  ?LIPASE, BLOOD  ? ? ?EKG ?None ? ?Radiology ?DG Abdomen Acute W/Chest ? ?Result Date: 11/13/2021 ?CLINICAL DATA:  Cough and vomiting. EXAM: DG ABDOMEN ACUTE WITH 1 VIEW CHEST COMPARISON:  Report from abdominopelvic CT 10/21/2015, images not available. FINDINGS: The lungs are clear. The heart is normal in size. No focal airspace disease, pleural effusion, or pneumothorax. No free intra-abdominal air. No bowel dilatation to suggest obstruction. No bowel air-fluid levels. Small volume of colonic stool. Cholecystectomy clips in the right upper quadrant. No radiopaque calculi. No acute osseous findings. IMPRESSION: Negative abdominal radiographs. No acute cardiopulmonary disease. Electronically Signed   By: Keith Rake M.D.   On: 11/13/2021 21:15   ? ?Procedures ?Procedures  ? ? ?Medications Ordered in ED ?Medications  ?sodium chloride 0.9 % bolus 1,000 mL (0 mLs Intravenous Stopped 11/13/21 2252)  ?  Followed by  ?0.9 %  sodium chloride infusion (1,000 mLs Intravenous New Bag/Given 11/13/21 2252)  ?ondansetron [facility]) injection 4 mg (4 mg Intravenous Given 11/13/21 2112)  ? ? ?ED Course/ Medical Decision Making/ A&P ?Clinical Course as of 11/13/21 2315  ?Wed Nov 13, 2021  ?2242 CBC ?Normal [JK]  ?2242 Comprehensive metabolic panel(!) ?Hyperglycemia noted [JK]  ?2243 Resp Panel by RT-PCR (Flu A&B, Covid) Nasopharyngeal Swab ?Normal [JK]  ?2243 Lipase, blood ?Normal [JK]  ?2243 DG Abdomen Acute W/Chest ?Acute abdominal series images and radiology report  reviewed.  No acute findings [JK]  ?  ?Clinical Course User Index ?[JK] Dorie Rank, MD  ? ?                        ?Medical Decision Making ?Amount and/or Complexity of Data Reviewed ?Labs: ordered. Decision-making details documented in ED Course. ?Radiology: ordered. Decision-making details documented in ED Course. ? ?Risk ?Prescription drug management. ? ? ?Patient presented to the ED with complaints of nausea vomiting diarrhea.  Concerned about the possibility of colitis diverticulitis  obstruction, viral gastroenteritis.  Patient also mention coughing so she was assessed for influenza and COVID.  ED work-up reassuring.  No signs of severe dehydration.  No leukocytosis.  COVID and flu are negative.  X-rays do not show obstruction or pneumonia.  Patient was treated with IV fluids and antiemetics.  She is feeling much better.  No recurrent vomiting.  Most likely viral gastroenteritis. ? ?Evaluation and diagnostic testing in the emergency department does not suggest an emergent condition requiring admission or immediate intervention beyond what has been performed at this time.  The patient is safe for discharge and has been instructed to return immediately for worsening symptoms, change in symptoms or any other concerns. ? ? ? ? ? ? ? ? ?Final Clinical Impression(s) / ED Diagnoses ?Final diagnoses:  ?Nausea vomiting and diarrhea  ? ? ?Rx / DC Orders ?ED Discharge Orders   ? ?      Ordered  ?  ondansetron (ZOFRAN-ODT) 8 MG disintegrating tablet  Every 8 hours PRN       ? 11/13/21 2315  ? ?  ?  ? ?  ? ? ?  ?Dorie Rank, MD ?11/13/21 2317 ? ?

## 2021-11-13 NOTE — ED Triage Notes (Signed)
Pt c/o n/v/d since Monday.  ?

## 2022-03-03 ENCOUNTER — Ambulatory Visit (INDEPENDENT_AMBULATORY_CARE_PROVIDER_SITE_OTHER): Payer: No Typology Code available for payment source | Admitting: Family Medicine

## 2022-03-03 VITALS — BP 113/68 | HR 73 | Temp 98.3°F | Ht 67.0 in | Wt 184.6 lb

## 2022-03-03 DIAGNOSIS — Z1211 Encounter for screening for malignant neoplasm of colon: Secondary | ICD-10-CM

## 2022-03-03 DIAGNOSIS — I10 Essential (primary) hypertension: Secondary | ICD-10-CM

## 2022-03-03 DIAGNOSIS — E782 Mixed hyperlipidemia: Secondary | ICD-10-CM | POA: Diagnosis not present

## 2022-03-03 DIAGNOSIS — Z130 Encounter for screening for diseases of the blood and blood-forming organs and certain disorders involving the immune mechanism: Secondary | ICD-10-CM

## 2022-03-03 DIAGNOSIS — E1142 Type 2 diabetes mellitus with diabetic polyneuropathy: Secondary | ICD-10-CM

## 2022-03-03 DIAGNOSIS — F418 Other specified anxiety disorders: Secondary | ICD-10-CM

## 2022-03-03 MED ORDER — VENLAFAXINE HCL ER 75 MG PO CP24: 150.0000 mg | ORAL_CAPSULE | Freq: Every day | ORAL | 3 refills | Status: DC

## 2022-03-03 NOTE — Assessment & Plan Note (Signed)
Lipid panel today.  Patient states that she does not tolerate cholesterol medication.

## 2022-03-03 NOTE — Assessment & Plan Note (Signed)
Stable.  Continue lisinopril.  Labs today. 

## 2022-03-03 NOTE — Assessment & Plan Note (Signed)
No recent A1c.  Awaiting A1c.  Continue metformin and Actos for now.

## 2022-03-03 NOTE — Patient Instructions (Signed)
Labs today.  Continue your medication.  Cologuard will come to your house. Follow the instructions.  Follow up in 6 months.  Take care  Dr. Adriana Simas

## 2022-03-03 NOTE — Progress Notes (Signed)
Subjective:  Patient ID: Denise Hartman, female    DOB: 1956/05/19  Age: 66 y.o. MRN: [id number]  CC: Chief Complaint  Patient presents with   New Patient (Initial Visit)   Headache    Just had eyes checked and waiting on new glasses.    HPI:  66 year old female presents to establish care.  Patient's hypertension is well controlled on lisinopril.  No recent labs available.  Patient reports that her sugars are fairly well controlled.  She is on metformin and Actos.  Has diabetic neuropathy and is on gabapentin as well.  No reports of hypoglycemia.  Needs labs.  Has had a recent eye exam.  Patient states that she has not tolerated pharmacotherapy regarding hyperlipidemia.  She states that she cannot tolerate statins due to arthralgias and myalgias.  Patient is in need of colon cancer screening.  She does not want a colonoscopy.  She would like to proceed with Cologuard.  Patient has not had a mammogram in many years.  Patient Active Problem List   Diagnosis Date Noted   Hypertension 12/26/2020   Gastroesophageal reflux disease without esophagitis 11/19/2020   Mixed hyperlipidemia 02/09/2020   Overweight with body mass index (BMI) of 28 to 28.9 in adult 04/26/2019   Depression with anxiety 03/24/2018   Type 2 diabetes mellitus with diabetic polyneuropathy, without long-term current use of insulin ([place]) 03/24/2018    Social Hx   Social History   Socioeconomic History   Marital status: Married    Spouse name: Not on file   Number of children: Not on file   Years of education: Not on file   Highest education level: Not on file  Occupational History   Not on file  Tobacco Use   Smoking status: Never   Smokeless tobacco: Not on file  Substance and Sexual Activity   Alcohol use: No   Drug use: No   Sexual activity: Yes    Birth control/protection: Surgical  Other Topics Concern   Not on file  Social History Narrative   Not on file   Social Determinants of Health    Financial Resource Strain: Not on file  Food Insecurity: Not on file  Transportation Needs: Not on file  Physical Activity: Not on file  Stress: Not on file  Social Connections: Not on file    Review of Systems  Constitutional: Negative.   Respiratory: Negative.    Cardiovascular: Negative.   Neurological:  Positive for headaches.   Objective:  BP 113/68   Pulse 73   Temp 98.3 F (36.8 C)   Ht _0  (1.702 m)   Wt 184 lb 9.6 oz (83.7 kg)   SpO2 97%   BMI 28.91 kg/m      03/03/2022    9:51 AM 11/13/2021   11:28 PM 11/13/2021   10:30 PM  BP/Weight  Systolic BP 242 353 614  Diastolic BP 68 57 62  Wt. (Lbs) 184.6    BMI 28.91 kg/m2      Physical Exam Vitals and nursing note reviewed.  Constitutional:      General: She is not in acute distress.    Appearance: Normal appearance.  HENT:     Head: Normocephalic and atraumatic.  Eyes:     General:        Right eye: No discharge.        Left eye: No discharge.     Conjunctiva/sclera: Conjunctivae normal.  Cardiovascular:     Rate and Rhythm: Normal rate  and regular rhythm.  Pulmonary:     Effort: Pulmonary effort is normal.     Breath sounds: Normal breath sounds. No wheezing, rhonchi or rales.  Abdominal:     General: There is no distension.     Palpations: Abdomen is soft.     Tenderness: There is no abdominal tenderness.  Neurological:     Mental Status: She is alert.  Psychiatric:        Mood and Affect: Mood normal.        Behavior: Behavior normal.    Assessment & Plan:   Problem List Items Addressed This Visit       Cardiovascular and Mediastinum   Hypertension - Primary    Stable.  Continue lisinopril.  Labs today.      Relevant Medications   lisinopril (ZESTRIL) 2.5 MG tablet     Endocrine   Type 2 diabetes mellitus with diabetic polyneuropathy, without long-term current use of insulin (HCC)    No recent A1c.  Awaiting A1c.  Continue metformin and Actos for now.      Relevant  Medications   gabapentin (NEURONTIN) 300 MG capsule   lisinopril (ZESTRIL) 2.5 MG tablet   pioglitazone (ACTOS) 30 MG tablet   venlafaxine XR (EFFEXOR-XR) 75 MG 24 hr capsule   Other Relevant Orders   CMP14+EGFR   Microalbumin / creatinine urine ratio   Hemoglobin A1c     Other   Depression with anxiety    Patient endorses stability.  Needs refill on Effexor.      Relevant Medications   venlafaxine XR (EFFEXOR-XR) 75 MG 24 hr capsule   Mixed hyperlipidemia    Lipid panel today.  Patient states that she does not tolerate cholesterol medication.      Relevant Medications   lisinopril (ZESTRIL) 2.5 MG tablet   Other Relevant Orders   Lipid panel   Other Visit Diagnoses     Screening for deficiency anemia       Relevant Orders   CBC   Colon cancer screening       Relevant Orders   Cologuard       Meds ordered this encounter  Medications   venlafaxine XR (EFFEXOR-XR) 75 MG 24 hr capsule    Sig: Take 2 capsules (150 mg total) by mouth at bedtime.    Dispense:  90 capsule    Refill:  3    Follow-up:  6 months  [place]

## 2022-03-03 NOTE — Assessment & Plan Note (Signed)
Patient endorses stability.  Needs refill on Effexor.

## 2022-03-04 LAB — CMP14+EGFR
ALT: 20 IU/L (ref 0–32)
AST: 23 IU/L (ref 0–40)
Albumin/Globulin Ratio: 1.4 (ref 1.2–2.2)
Albumin: 4.1 g/dL (ref 3.9–4.9)
Alkaline Phosphatase: 106 IU/L (ref 44–121)
BUN/Creatinine Ratio: 22 (ref 12–28)
BUN: 15 mg/dL (ref 8–27)
Bilirubin Total: 0.7 mg/dL (ref 0.0–1.2)
CO2: 25 mmol/L (ref 20–29)
Calcium: 10.2 mg/dL (ref 8.7–10.3)
Chloride: 101 mmol/L (ref 96–106)
Creatinine, Ser: 0.68 mg/dL (ref 0.57–1.00)
Globulin, Total: 2.9 g/dL (ref 1.5–4.5)
Glucose: 191 mg/dL — ABNORMAL HIGH (ref 70–99)
Potassium: 4.4 mmol/L (ref 3.5–5.2)
Sodium: 140 mmol/L (ref 134–144)
Total Protein: 7 g/dL (ref 6.0–8.5)
eGFR: 97 mL/min/{1.73_m2} (ref >59)

## 2022-03-04 LAB — HEMOGLOBIN A1C
Est. average glucose Bld gHb Est-mCnc: 203 mg/dL
Hgb A1c MFr Bld: 8.7 % — ABNORMAL HIGH (ref 4.8–5.6)

## 2022-03-04 LAB — MICROALBUMIN / CREATININE URINE RATIO
Creatinine, Urine: 197.3 mg/dL
Microalb/Creat Ratio: 13 mg/g creat (ref 0–29)
Microalbumin, Urine: 25.6 ug/mL

## 2022-03-04 LAB — CBC
Hematocrit: 40.4 % (ref 34.0–46.6)
Hemoglobin: 13.2 g/dL (ref 11.1–15.9)
MCH: 28.2 pg (ref 26.6–33.0)
MCHC: 32.7 g/dL (ref 31.5–35.7)
MCV: 86 fL (ref 79–97)
Platelets: 180 10*3/uL (ref 150–450)
RBC: 4.68 x10E6/uL (ref 3.77–5.28)
RDW: 12.3 % (ref 11.7–15.4)
WBC: 5.1 10*3/uL (ref 3.4–10.8)

## 2022-03-04 LAB — LIPID PANEL
Chol/HDL Ratio: 4.7 ratio — ABNORMAL HIGH (ref 0.0–4.4)
Cholesterol, Total: 264 mg/dL — ABNORMAL HIGH (ref 100–199)
HDL: 56 mg/dL (ref >39)
LDL Chol Calc (NIH): 160 mg/dL — ABNORMAL HIGH (ref 0–99)
Triglycerides: 259 mg/dL — ABNORMAL HIGH (ref 0–149)
VLDL Cholesterol Cal: 48 mg/dL — ABNORMAL HIGH (ref 5–40)

## 2022-03-05 MED ORDER — METFORMIN HCL 500 MG PO TABS: 1000.0000 mg | ORAL_TABLET | Freq: Two times a day (BID) | ORAL | 0 refills | Status: DC

## 2022-03-05 NOTE — Addendum Note (Signed)
Addended by: Margaretha Sheffield on: 03/05/2022 03:15 PM   Modules accepted: Orders

## 2022-03-18 ENCOUNTER — Ambulatory Visit: Payer: No Typology Code available for payment source | Admitting: Family Medicine

## 2022-03-25 ENCOUNTER — Encounter: Payer: Self-pay | Admitting: Family Medicine

## 2022-03-25 ENCOUNTER — Ambulatory Visit (INDEPENDENT_AMBULATORY_CARE_PROVIDER_SITE_OTHER): Payer: No Typology Code available for payment source | Admitting: Family Medicine

## 2022-03-25 DIAGNOSIS — E782 Mixed hyperlipidemia: Secondary | ICD-10-CM

## 2022-03-25 DIAGNOSIS — E1142 Type 2 diabetes mellitus with diabetic polyneuropathy: Secondary | ICD-10-CM

## 2022-03-25 MED ORDER — TRULICITY 0.75 MG/0.5ML ~~LOC~~ SOAJ: 0.7500 mg | SUBCUTANEOUS | 0 refills | Status: DC

## 2022-03-25 NOTE — Patient Instructions (Signed)
Trulicity as prescribed.  As you approach the end of the prescription, I will increase the dose/refill.  Follow up in 3 months.  Take care  Dr. Adriana Simas

## 2022-03-26 NOTE — Assessment & Plan Note (Addendum)
Uncontrolled/worsening. I have asked patient to avoid lots of sugars/sweets.  She will work on her diet. After discussion of additional pharmacotherapy, we elected to start Trulicity.  Continue metformin and Actos.  Follow-up in 3 months.

## 2022-03-26 NOTE — Assessment & Plan Note (Signed)
We discussed alternative pharmacotherapy regarding her lipids.  Recommended trial of Zetia.  We will wait until follow-up to start given addition of new diabetes medication.

## 2022-03-26 NOTE — Progress Notes (Signed)
Subjective:  Patient ID: Denise Hartman, female    DOB: 1955/10/09  Age: 66 y.o. MRN: [id number]  CC: Chief Complaint  Patient presents with   Hyperlipidemia    HPI:  67 year old female with hypertension, hyperlipidemia, type 2 diabetes presents for follow-up regarding recent labs.  Recent labs notable for uncontrolled lipids with total cholesterol 264, elevated triglycerides at 259, and elevated VLDL and LDL.  Patient states that she has tried multiple statins in the past and has been intolerant.  Will discuss alternative treatments today.  Labs also revealed that her A1c is uncontrolled.  A1c 8.7.  Patient states that she believes that this is due to the fact that she has been eating more sweets.  Patient is on Actos which was not started by me.  She is also on metformin 1000 mg twice daily.  Will discuss lifestyle changes as well as pharmacotherapy today.  Patient Active Problem List   Diagnosis Date Noted   Hypertension 12/26/2020   Gastroesophageal reflux disease without esophagitis 11/19/2020   Mixed hyperlipidemia 02/09/2020   Overweight with body mass index (BMI) of 28 to 28.9 in adult 04/26/2019   Depression with anxiety 03/24/2018   Type 2 diabetes mellitus with diabetic polyneuropathy, without long-term current use of insulin ([facility]) 03/24/2018    Social Hx   Social History   Socioeconomic History   Marital status: Married    Spouse name: Not on file   Number of children: Not on file   Years of education: Not on file   Highest education level: Not on file  Occupational History   Not on file  Tobacco Use   Smoking status: Never   Smokeless tobacco: Not on file  Substance and Sexual Activity   Alcohol use: No   Drug use: No   Sexual activity: Yes    Birth control/protection: Surgical  Other Topics Concern   Not on file  Social History Narrative   Not on file   Social Determinants of Health   Financial Resource Strain: Not on file  Food Insecurity: Not on  file  Transportation Needs: Not on file  Physical Activity: Not on file  Stress: Not on file  Social Connections: Not on file    Review of Systems  Constitutional: Negative.   Respiratory: Negative.    Cardiovascular: Negative.    Objective:  BP 118/60   Pulse 95   Temp (!) 97.3 F (36.3 C)   Ht 5\' 7"  (1.702 m)   Wt 184 lb (83.5 kg)   SpO2 97%   BMI 28.82 kg/m      03/25/2022    3:08 PM 03/03/2022    9:51 AM 11/13/2021   11:28 PM  BP/Weight  Systolic BP 118 113 126  Diastolic BP 60 68 57  Wt. (Lbs) 184 184.6   BMI 28.82 kg/m2 28.91 kg/m2     Physical Exam Constitutional:      General: She is not in acute distress.    Appearance: Normal appearance.  HENT:     Head: Normocephalic and atraumatic.  Cardiovascular:     Rate and Rhythm: Normal rate and regular rhythm.  Pulmonary:     Effort: Pulmonary effort is normal.     Breath sounds: Normal breath sounds. No wheezing, rhonchi or rales.  Neurological:     Mental Status: She is alert.  Psychiatric:        Mood and Affect: Mood normal.        Behavior: Behavior normal.  Assessment & Plan:   Problem List Items Addressed This Visit       Endocrine   Type 2 diabetes mellitus with diabetic polyneuropathy, without long-term current use of insulin ([facility])    Uncontrolled/worsening. I have asked patient to avoid lots of sugars/sweets.  She will work on her diet. After discussion of additional pharmacotherapy, we elected to start Trulicity.  Continue metformin and Actos.  Follow-up in 3 months.      Relevant Medications   Dulaglutide (TRULICITY) 0.75 MG/0.5ML SOPN     Other   Mixed hyperlipidemia    We discussed alternative pharmacotherapy regarding her lipids.  Recommended trial of Zetia.  We will wait until follow-up to start given addition of new diabetes medication.       Meds ordered this encounter  Medications   Dulaglutide (TRULICITY) 0.75 MG/0.5ML SOPN    Sig: Inject 0.75 mg into the skin once a  week.    Dispense:  2 mL    Refill:  0    Follow-up:  Return in about 3 months (around 06/25/2022).  Everlene Other DO [facility]

## 2022-03-28 ENCOUNTER — Telehealth: Payer: Self-pay | Admitting: Family Medicine

## 2022-03-28 NOTE — Telephone Encounter (Signed)
PA for Trulicity approved from 03/13/22-03/27/23

## 2022-04-03 ENCOUNTER — Telehealth: Payer: Self-pay | Admitting: Family Medicine

## 2022-04-03 NOTE — Telephone Encounter (Signed)
[employer] calling to request alternative number for patient. Pt number in chart not working. [employer] is needing patient to call them to get correct time and date of sample. Nurse contacted both number in chart but primary number is not working and work number is to ED desk and pt is not there today. Contacted brothers number [employer] but had to leave message on phone. Will send my chart message. [employer] [phone] and ask to speak to patient support.

## 2022-04-11 IMAGING — DX DG ABDOMEN ACUTE W/ 1V CHEST
4 series · 4 of 4 positions shown · non-contrast
Comparison: Report from abdominopelvic CT 10/21/2015, images not
available.

CLINICAL DATA: Cough and vomiting.

EXAM:
DG ABDOMEN ACUTE WITH 1 VIEW CHEST

[chest ap]
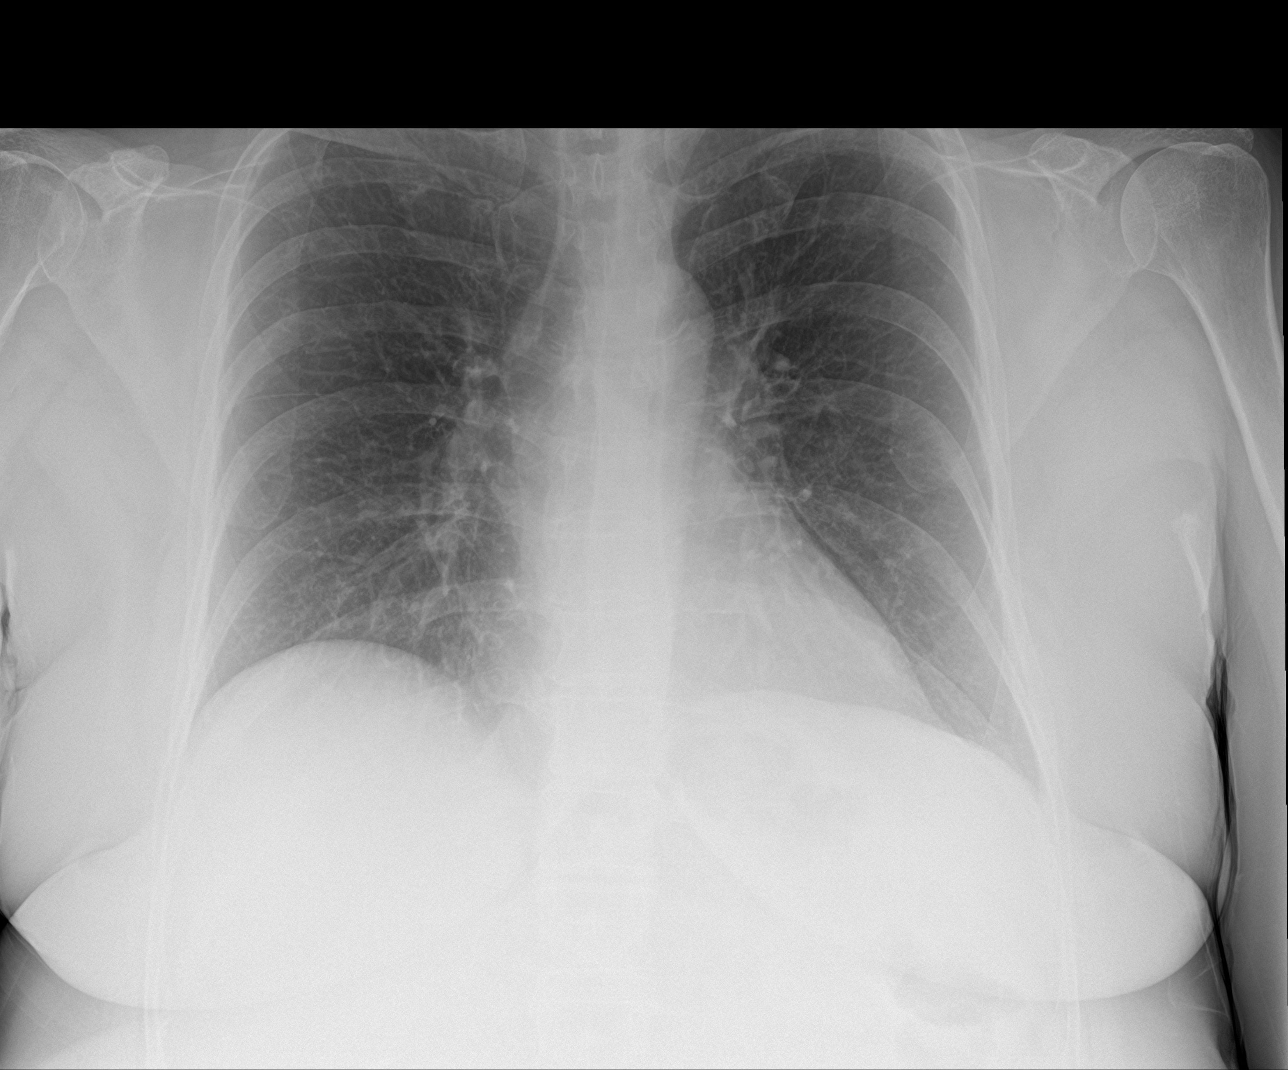

[abdomen erect]
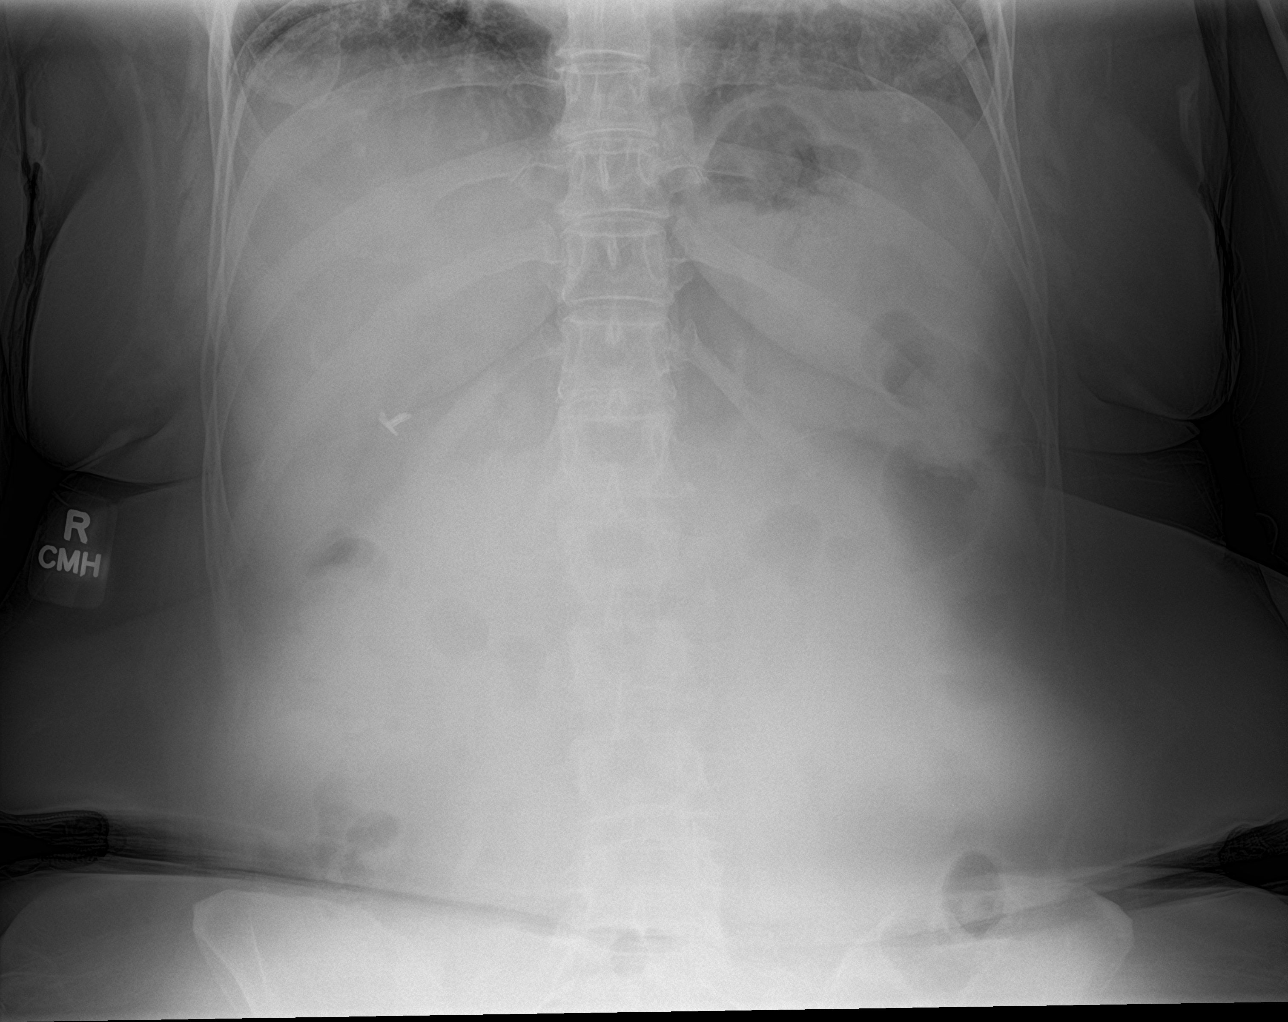

[abdomen supine (1 of 2)]
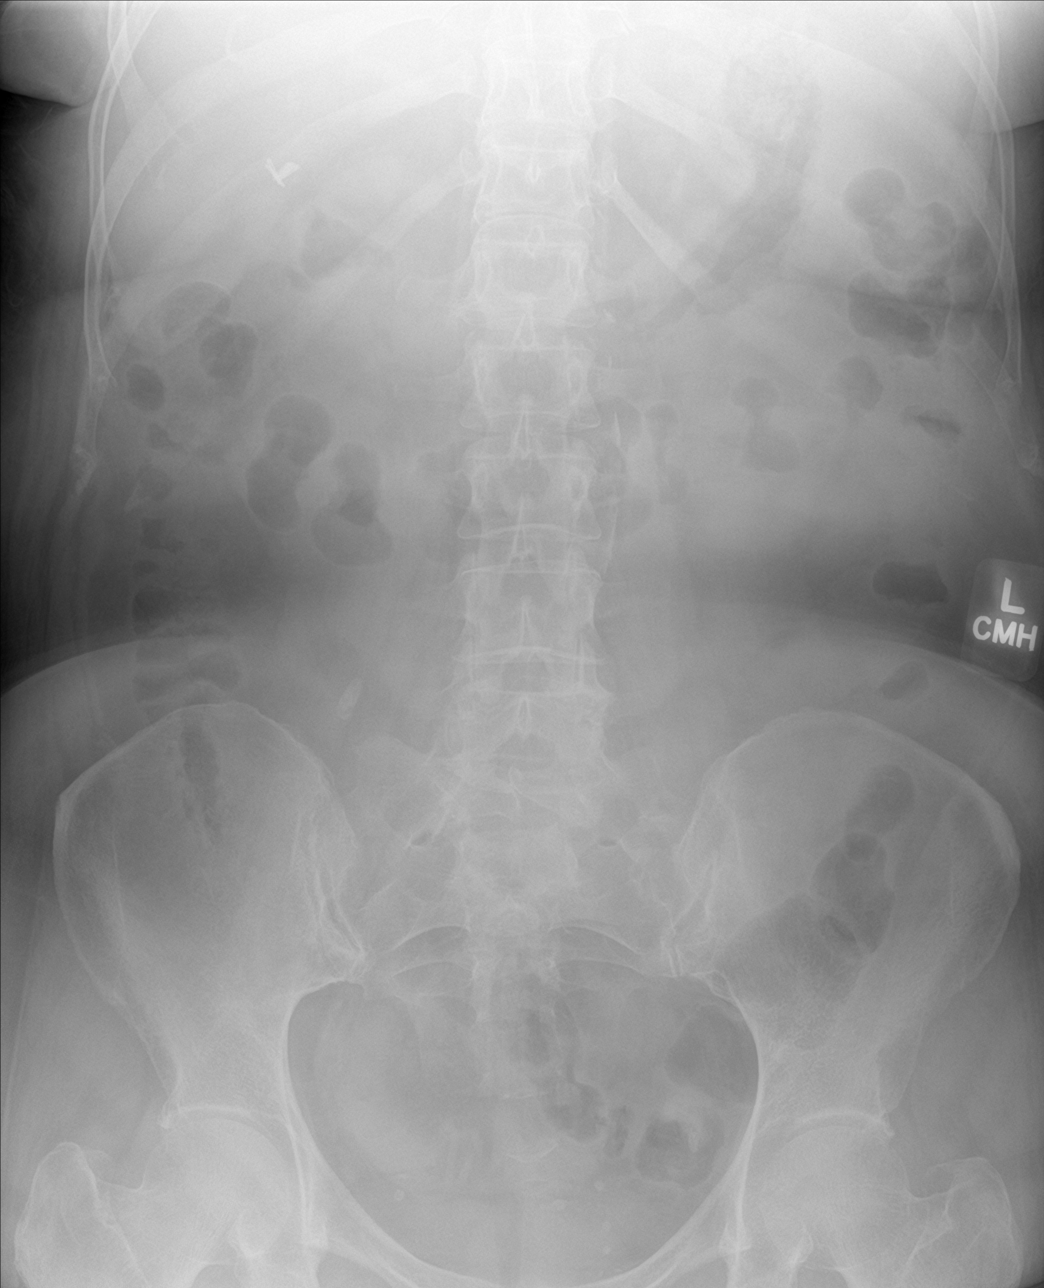

[abdomen supine (2 of 2)]
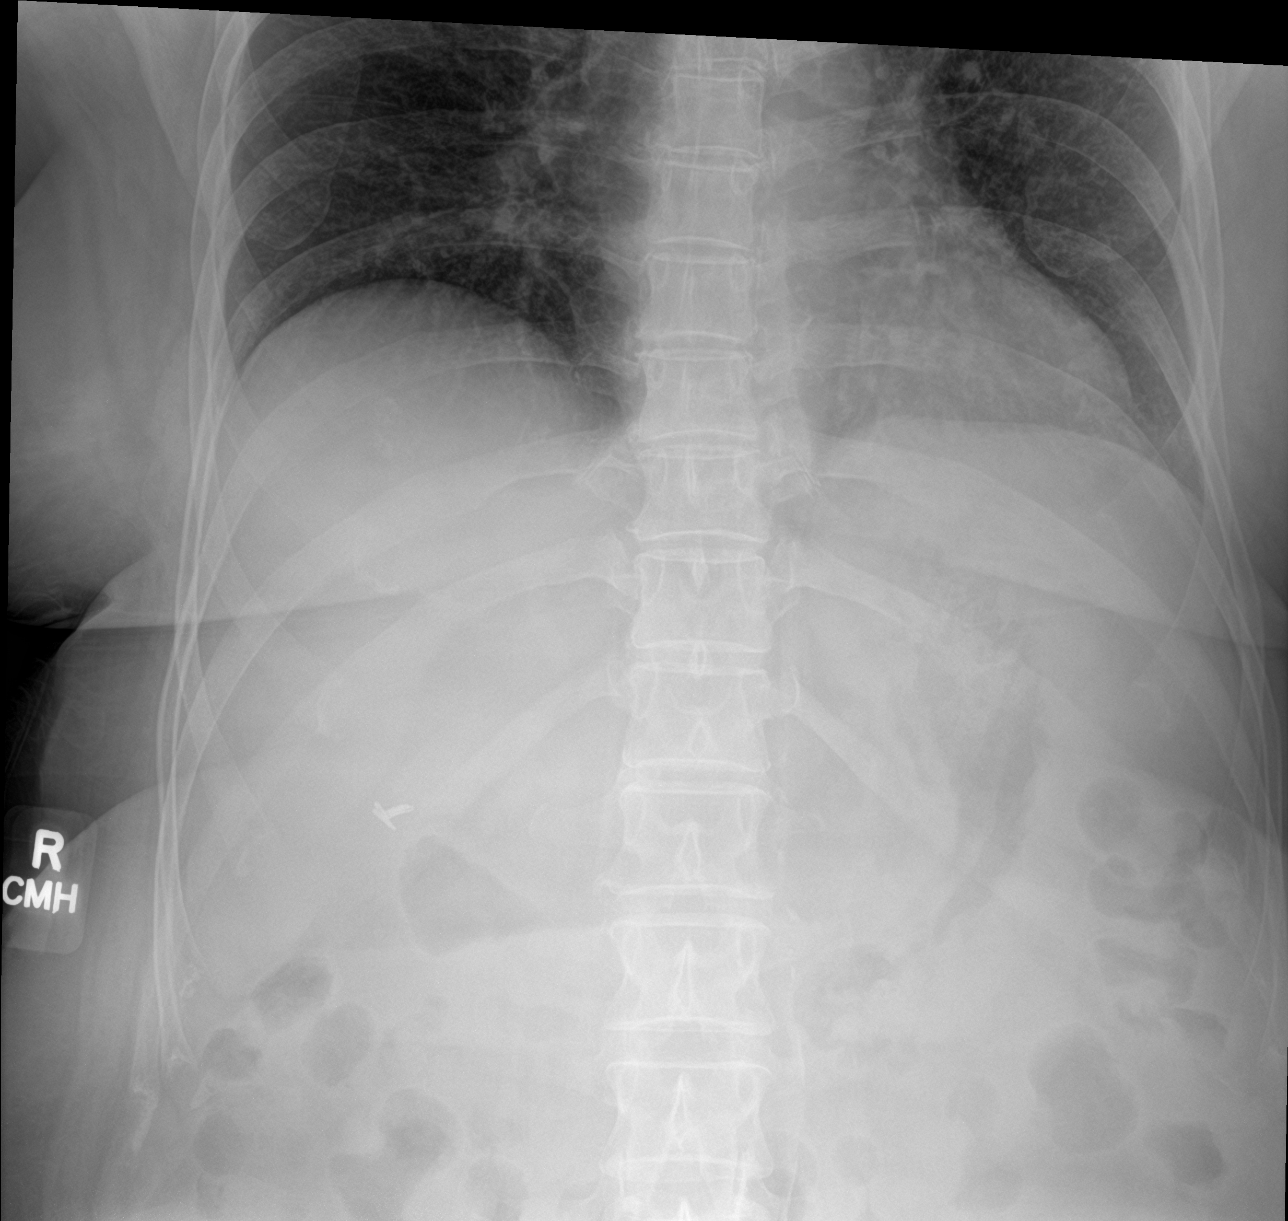

[4 of 4 positions shown; findings below may reference images not displayed]

FINDINGS: The lungs are clear. The heart is normal in size. No focal airspace
disease, pleural effusion, or pneumothorax.

No free intra-abdominal air. No bowel dilatation to suggest
obstruction. No bowel air-fluid levels. Small volume of colonic
stool. Cholecystectomy clips in the right upper quadrant. No
radiopaque calculi. No acute osseous findings.
IMPRESSION: Negative abdominal radiographs. No acute cardiopulmonary disease.

## 2022-05-04 LAB — COLOGUARD: COLOGUARD: NEGATIVE

## 2022-06-26 ENCOUNTER — Encounter: Payer: Self-pay | Admitting: Family Medicine

## 2022-06-26 ENCOUNTER — Ambulatory Visit (INDEPENDENT_AMBULATORY_CARE_PROVIDER_SITE_OTHER): Payer: 59 | Admitting: Family Medicine

## 2022-06-26 VITALS — BP 112/70 | HR 95 | Temp 97.9°F | Wt 180.2 lb

## 2022-06-26 DIAGNOSIS — E1142 Type 2 diabetes mellitus with diabetic polyneuropathy: Secondary | ICD-10-CM

## 2022-06-26 DIAGNOSIS — R5383 Other fatigue: Secondary | ICD-10-CM | POA: Diagnosis not present

## 2022-06-26 DIAGNOSIS — M25511 Pain in right shoulder: Secondary | ICD-10-CM | POA: Diagnosis not present

## 2022-06-26 DIAGNOSIS — I10 Essential (primary) hypertension: Secondary | ICD-10-CM | POA: Diagnosis not present

## 2022-06-26 MED ORDER — GABAPENTIN 300 MG PO CAPS: ORAL_CAPSULE | ORAL | 3 refills | Status: AC

## 2022-06-26 MED ORDER — FREESTYLE LIBRE 2 READER DEVI: 0 refills | Status: DC

## 2022-06-26 MED ORDER — FREESTYLE LIBRE 2 SENSOR MISC: 6 refills | Status: AC

## 2022-06-26 NOTE — Patient Instructions (Signed)
Labs today.  Follow up in 3 months.  Take care  Dr. Jerritt Cardoza  

## 2022-06-27 DIAGNOSIS — R5383 Other fatigue: Secondary | ICD-10-CM | POA: Insufficient documentation

## 2022-06-27 DIAGNOSIS — M25511 Pain in right shoulder: Secondary | ICD-10-CM | POA: Insufficient documentation

## 2022-06-27 LAB — TSH: TSH: 4.05 u[IU]/mL (ref 0.450–4.500)

## 2022-06-27 LAB — HEMOGLOBIN A1C
Est. average glucose Bld gHb Est-mCnc: 229 mg/dL
Hgb A1c MFr Bld: 9.6 % — ABNORMAL HIGH (ref 4.8–5.6)

## 2022-06-27 NOTE — Assessment & Plan Note (Signed)
A1c returned and is worsening. Needs increase in pharmacotherapy.  We need to see if we can get Trulicity on board for her.  Needs referral to endocrinology.

## 2022-06-27 NOTE — Progress Notes (Signed)
refe  Subjective:  Patient ID: Denise Hartman, female    DOB: 09-Jun-1956  Age: 66 y.o. MRN: [id number]  CC: Chief Complaint  Patient presents with   Hypertension    Pt arrives for follow up. Pt states she has had some dizziness. Right shoulder pain began Sunday. Pt would also like thyroid checked. Pt states she feels "heavy" and tired all the time.     HPI:  66 year old female with hypertension, type 2 diabetes, hyperlipidemia presents for follow-up.  Patient reports that she got Trulicity for about a month and then her insurance no longer covered it.  She is unsure why.  She is currently on metformin and Actos.  Needs A1c today.  Patient would like a CGM if it can be covered by her insurance.  Hypertension is well controlled on lisinopril.  Patient reports that she has had ongoing fatigue.  She has had transient dizziness.  She is concerned that she may have thyroid issues.  Would like thyroid testing.  Additionally, patient reports ongoing right shoulder pain.  She states that she had previous issue with her shoulder bursitis.  She states that she has responded well to corticosteroid injections in the past.  She is requesting this today.  Patient Active Problem List   Diagnosis Date Noted   Fatigue 06/27/2022   Right shoulder pain 06/27/2022   Hypertension 12/26/2020   Gastroesophageal reflux disease without esophagitis 11/19/2020   Mixed hyperlipidemia 02/09/2020   Overweight with body mass index (BMI) of 28 to 28.9 in adult 04/26/2019   Depression with anxiety 03/24/2018   Type 2 diabetes mellitus with diabetic polyneuropathy, without long-term current use of insulin (HCC) 03/24/2018    Social Hx   Social History   Socioeconomic History   Marital status: Married    Spouse name: Not on file   Number of children: Not on file   Years of education: Not on file   Highest education level: Not on file  Occupational History   Not on file  Tobacco Use   Smoking status: Never    Smokeless tobacco: Not on file  Substance and Sexual Activity   Alcohol use: No   Drug use: No   Sexual activity: Yes    Birth control/protection: Surgical  Other Topics Concern   Not on file  Social History Narrative   Not on file   Social Determinants of Health   Financial Resource Strain: Not on file  Food Insecurity: Not on file  Transportation Needs: Not on file  Physical Activity: Not on file  Stress: Not on file  Social Connections: Not on file    Review of Systems Per HPI  Objective:  BP 112/70   Pulse 95   Temp 97.9 F (36.6 C)   Wt 180 lb 3.2 oz (81.7 kg)   SpO2 97%   BMI 28.22 kg/m      11 /16/2023    1:15 PM 03/25/2022    3:08 PM 03/03/2022    9:51 AM  BP/Weight  Systolic BP 112 118 113  Diastolic BP 70 60 68  Wt. (Lbs) 180.2 184 184.6  BMI 28.22 kg/m2 28.82 kg/m2 28.91 kg/m2    Physical Exam Vitals and nursing note reviewed.  Constitutional:      General: She is not in acute distress.    Appearance: Normal appearance.  HENT:     Head: Normocephalic and atraumatic.  Eyes:     General:        Right eye: No discharge.  Left eye: No discharge.     Conjunctiva/sclera: Conjunctivae normal.  Cardiovascular:     Rate and Rhythm: Normal rate and regular rhythm.  Pulmonary:     Effort: Pulmonary effort is normal.     Breath sounds: Normal breath sounds. No wheezing or rales.  Musculoskeletal:     Comments: Patient with tenderness to the right shoulder posteriorly.  Decreased range of motion particularly in abduction.  Rotator cuff strength normal.  Neurological:     Mental Status: She is alert.  Psychiatric:        Mood and Affect: Mood normal.        Behavior: Behavior normal.     Lab Results  Component Value Date   WBC 5.1 03/03/2022   HGB 13.2 03/03/2022   HCT 40.4 03/03/2022   PLT 180 03/03/2022   GLUCOSE 191 (H) 03/03/2022   CHOL 264 (H) 03/03/2022   TRIG 259 (H) 03/03/2022   HDL 56 03/03/2022   LDLCALC 160 (H)  03/03/2022   ALT 20 03/03/2022   AST 23 03/03/2022   NA 140 03/03/2022   K 4.4 03/03/2022   CL 101 03/03/2022   CREATININE 0.68 03/03/2022   BUN 15 03/03/2022   CO2 25 03/03/2022   TSH 4.050 06/26/2022   INR 0.9 08/23/2007   HGBA1C 9.6 (H) 06/26/2022   Procedure: Subacromial bursa injection Medication:  80 mg DepoMedrol and 4 mL of 1% lidocaine without epinephrine Preparation: area cleansed with alcohol x 3  Injection: Landmarks identified Above medication injected using a standard posterior approach. Patient tolerated well without bleeding or paresthesias   Assessment & Plan:   Problem List Items Addressed This Visit       Cardiovascular and Mediastinum   Hypertension    BP well controlled on Lisinopril. Continue.         Endocrine   Type 2 diabetes mellitus with diabetic polyneuropathy, without long-term current use of insulin (HCC) - Primary    A1c returned and is worsening. Needs increase in pharmacotherapy.  We need to see if we can get Trulicity on board for her.  Needs referral to endocrinology.      Relevant Medications   gabapentin (NEURONTIN) 300 MG capsule   Other Relevant Orders   Hemoglobin A1c (Completed)   Ambulatory referral to Endocrinology     Other   Right shoulder pain    Secondary to bursitis versus rotator cuff pathology.  Corticosteroid injection given today.      Fatigue    I suspect that depression and anxiety is playing a role.  TSH was normal.  Supportive care.      Relevant Orders   TSH (Completed)    Meds ordered this encounter  Medications   Continuous Blood Gluc Sensor (FREESTYLE LIBRE 2 SENSOR) MISC    Sig: Apply as directed to check blood glucose. Change every 14 days.    Dispense:  2 each    Refill:  6   Continuous Blood Gluc Receiver (FREESTYLE LIBRE 2 READER) DEVI    Sig: Use as directed to check blood glucose.    Dispense:  1 each    Refill:  0   gabapentin (NEURONTIN) 300 MG capsule    Sig: TAKE 2 CAPSULES BY  MOUTH NIGHTLY    Dispense:  180 capsule    Refill:  3    Follow-up:  3 months  Jionni Helming Adriana Simas DO [facility]

## 2022-06-27 NOTE — Assessment & Plan Note (Signed)
BP well controlled on Lisinopril. Continue.

## 2022-06-27 NOTE — Assessment & Plan Note (Signed)
Secondary to bursitis versus rotator cuff pathology.  Corticosteroid injection given today.

## 2022-06-27 NOTE — Assessment & Plan Note (Signed)
I suspect that depression and anxiety is playing a role.  TSH was normal.  Supportive care.

## 2022-06-29 ENCOUNTER — Other Ambulatory Visit: Payer: Self-pay

## 2022-06-29 ENCOUNTER — Emergency Department (HOSPITAL_COMMUNITY): Payer: 59

## 2022-06-29 ENCOUNTER — Inpatient Hospital Stay (HOSPITAL_COMMUNITY)
Admission: EM | Admit: 2022-06-29 | Discharge: 2022-07-03 | DRG: 522 | Disposition: A | Discharge status: Home Health | Payer: 59 | Attending: Internal Medicine | Admitting: Internal Medicine

## 2022-06-29 ENCOUNTER — Encounter (HOSPITAL_COMMUNITY): Payer: Self-pay | Admitting: Internal Medicine

## 2022-06-29 DIAGNOSIS — I959 Hypotension, unspecified: Secondary | ICD-10-CM | POA: Diagnosis not present

## 2022-06-29 DIAGNOSIS — Z91013 Allergy to seafood: Secondary | ICD-10-CM

## 2022-06-29 DIAGNOSIS — S72041A Displaced fracture of base of neck of right femur, initial encounter for closed fracture: Secondary | ICD-10-CM | POA: Diagnosis not present

## 2022-06-29 DIAGNOSIS — Z7984 Long term (current) use of oral hypoglycemic drugs: Secondary | ICD-10-CM

## 2022-06-29 DIAGNOSIS — E663 Overweight: Secondary | ICD-10-CM | POA: Diagnosis present

## 2022-06-29 DIAGNOSIS — I9589 Other hypotension: Secondary | ICD-10-CM | POA: Diagnosis not present

## 2022-06-29 DIAGNOSIS — I10 Essential (primary) hypertension: Secondary | ICD-10-CM | POA: Diagnosis not present

## 2022-06-29 DIAGNOSIS — D62 Acute posthemorrhagic anemia: Secondary | ICD-10-CM | POA: Diagnosis not present

## 2022-06-29 DIAGNOSIS — K219 Gastro-esophageal reflux disease without esophagitis: Secondary | ICD-10-CM | POA: Diagnosis present

## 2022-06-29 DIAGNOSIS — Y9222 Religious institution as the place of occurrence of the external cause: Secondary | ICD-10-CM

## 2022-06-29 DIAGNOSIS — R0689 Other abnormalities of breathing: Secondary | ICD-10-CM | POA: Diagnosis not present

## 2022-06-29 DIAGNOSIS — T68XXXA Hypothermia, initial encounter: Secondary | ICD-10-CM | POA: Diagnosis not present

## 2022-06-29 DIAGNOSIS — Z7982 Long term (current) use of aspirin: Secondary | ICD-10-CM

## 2022-06-29 DIAGNOSIS — Z6828 Body mass index (BMI) 28.0-28.9, adult: Secondary | ICD-10-CM | POA: Diagnosis not present

## 2022-06-29 DIAGNOSIS — Z8249 Family history of ischemic heart disease and other diseases of the circulatory system: Secondary | ICD-10-CM | POA: Diagnosis not present

## 2022-06-29 DIAGNOSIS — Z471 Aftercare following joint replacement surgery: Secondary | ICD-10-CM | POA: Diagnosis not present

## 2022-06-29 DIAGNOSIS — Z885 Allergy status to narcotic agent status: Secondary | ICD-10-CM

## 2022-06-29 DIAGNOSIS — E860 Dehydration: Secondary | ICD-10-CM | POA: Diagnosis present

## 2022-06-29 DIAGNOSIS — Z882 Allergy status to sulfonamides status: Secondary | ICD-10-CM | POA: Diagnosis not present

## 2022-06-29 DIAGNOSIS — S72011A Unspecified intracapsular fracture of right femur, initial encounter for closed fracture: Secondary | ICD-10-CM | POA: Diagnosis not present

## 2022-06-29 DIAGNOSIS — S72001A Fracture of unspecified part of neck of right femur, initial encounter for closed fracture: Secondary | ICD-10-CM | POA: Diagnosis not present

## 2022-06-29 DIAGNOSIS — Z880 Allergy status to penicillin: Secondary | ICD-10-CM

## 2022-06-29 DIAGNOSIS — S72009A Fracture of unspecified part of neck of unspecified femur, initial encounter for closed fracture: Secondary | ICD-10-CM | POA: Diagnosis not present

## 2022-06-29 DIAGNOSIS — F418 Other specified anxiety disorders: Secondary | ICD-10-CM | POA: Diagnosis not present

## 2022-06-29 DIAGNOSIS — N179 Acute kidney failure, unspecified: Secondary | ICD-10-CM | POA: Diagnosis not present

## 2022-06-29 DIAGNOSIS — E782 Mixed hyperlipidemia: Secondary | ICD-10-CM | POA: Diagnosis not present

## 2022-06-29 DIAGNOSIS — E1142 Type 2 diabetes mellitus with diabetic polyneuropathy: Secondary | ICD-10-CM | POA: Diagnosis not present

## 2022-06-29 DIAGNOSIS — Z043 Encounter for examination and observation following other accident: Secondary | ICD-10-CM | POA: Diagnosis not present

## 2022-06-29 DIAGNOSIS — Z79899 Other long term (current) drug therapy: Secondary | ICD-10-CM | POA: Diagnosis not present

## 2022-06-29 DIAGNOSIS — Z90710 Acquired absence of both cervix and uterus: Secondary | ICD-10-CM

## 2022-06-29 DIAGNOSIS — M6281 Muscle weakness (generalized): Secondary | ICD-10-CM | POA: Diagnosis not present

## 2022-06-29 DIAGNOSIS — W108XXA Fall (on) (from) other stairs and steps, initial encounter: Secondary | ICD-10-CM | POA: Diagnosis not present

## 2022-06-29 DIAGNOSIS — W19XXXA Unspecified fall, initial encounter: Secondary | ICD-10-CM | POA: Diagnosis not present

## 2022-06-29 DIAGNOSIS — E1165 Type 2 diabetes mellitus with hyperglycemia: Secondary | ICD-10-CM | POA: Diagnosis present

## 2022-06-29 DIAGNOSIS — D72829 Elevated white blood cell count, unspecified: Secondary | ICD-10-CM | POA: Diagnosis present

## 2022-06-29 DIAGNOSIS — M25551 Pain in right hip: Secondary | ICD-10-CM | POA: Diagnosis not present

## 2022-06-29 DIAGNOSIS — D529 Folate deficiency anemia, unspecified: Secondary | ICD-10-CM | POA: Diagnosis not present

## 2022-06-29 DIAGNOSIS — Z96641 Presence of right artificial hip joint: Secondary | ICD-10-CM | POA: Diagnosis not present

## 2022-06-29 LAB — CBC WITH DIFFERENTIAL/PLATELET
Abs Immature Granulocytes: 0.03 10*3/uL (ref 0.00–0.07)
Basophils Absolute: 0 10*3/uL (ref 0.0–0.1)
Basophils Relative: 1 %
Eosinophils Absolute: 0.1 10*3/uL (ref 0.0–0.5)
Eosinophils Relative: 1 %
HCT: 39 % (ref 36.0–46.0)
Hemoglobin: 12.6 g/dL (ref 12.0–15.0)
Immature Granulocytes: 0 %
Lymphocytes Relative: 30 %
Lymphs Abs: 2.3 10*3/uL (ref 0.7–4.0)
MCH: 28.8 pg (ref 26.0–34.0)
MCHC: 32.3 g/dL (ref 30.0–36.0)
MCV: 89.2 fL (ref 80.0–100.0)
Monocytes Absolute: 0.5 10*3/uL (ref 0.1–1.0)
Monocytes Relative: 6 %
Neutro Abs: 4.8 10*3/uL (ref 1.7–7.7)
Neutrophils Relative %: 62 %
Platelets: 163 10*3/uL (ref 150–400)
RBC: 4.37 MIL/uL (ref 3.87–5.11)
RDW: 13.3 % (ref 11.5–15.5)
WBC: 7.8 10*3/uL (ref 4.0–10.5)
nRBC: 0 % (ref 0.0–0.2)

## 2022-06-29 LAB — COMPREHENSIVE METABOLIC PANEL
ALT: 29 U/L (ref 0–44)
AST: 39 U/L (ref 15–41)
Albumin: 3.7 g/dL (ref 3.5–5.0)
Alkaline Phosphatase: 88 U/L (ref 38–126)
Anion gap: 13 (ref 5–15)
BUN: 22 mg/dL (ref 8–23)
CO2: 25 mmol/L (ref 22–32)
Calcium: 9.8 mg/dL (ref 8.9–10.3)
Chloride: 98 mmol/L (ref 98–111)
Creatinine, Ser: 0.65 mg/dL (ref 0.44–1.00)
GFR, Estimated: >60 mL/min (ref >60)
Glucose, Bld: 165 mg/dL — ABNORMAL HIGH (ref 70–99)
Potassium: 3.8 mmol/L (ref 3.5–5.1)
Sodium: 136 mmol/L (ref 135–145)
Total Bilirubin: 1 mg/dL (ref 0.3–1.2)
Total Protein: 6.9 g/dL (ref 6.5–8.1)

## 2022-06-29 LAB — SURGICAL PCR SCREEN
MRSA, PCR: NEGATIVE
Staphylococcus aureus: POSITIVE — AB

## 2022-06-29 LAB — GLUCOSE, CAPILLARY
Glucose-Capillary: 172 mg/dL — ABNORMAL HIGH (ref 70–99)
Glucose-Capillary: 175 mg/dL — ABNORMAL HIGH (ref 70–99)

## 2022-06-29 MED ORDER — SODIUM CHLORIDE 0.9 % IV SOLN: INTRAVENOUS | Status: DC

## 2022-06-29 MED ORDER — HYDROMORPHONE HCL 1 MG/ML IJ SOLN
1.0000 mg | Freq: Once | INTRAMUSCULAR | Status: AC
  Administered 2022-06-29: 1 mg via INTRAVENOUS
  Filled 2022-06-29: qty 1

## 2022-06-29 MED ORDER — ONDANSETRON HCL 4 MG/2ML IJ SOLN
4.0000 mg | Freq: Once | INTRAMUSCULAR | Status: AC
  Administered 2022-06-29: 4 mg via INTRAVENOUS
  Filled 2022-06-29: qty 2

## 2022-06-29 MED ORDER — ENOXAPARIN SODIUM 40 MG/0.4ML IJ SOSY
40.0000 mg | PREFILLED_SYRINGE | INTRAMUSCULAR | Status: DC
  Administered 2022-06-29: 40 mg via SUBCUTANEOUS
  Filled 2022-06-29: qty 0.4

## 2022-06-29 MED ORDER — INSULIN ASPART 100 UNIT/ML IJ SOLN
0.0000 [IU] | INTRAMUSCULAR | Status: DC
  Administered 2022-06-29: 3 [IU] via SUBCUTANEOUS
  Administered 2022-06-30: 11 [IU] via SUBCUTANEOUS
  Administered 2022-06-30: 2 [IU] via SUBCUTANEOUS
  Administered 2022-06-30 (×2): 3 [IU] via SUBCUTANEOUS
  Administered 2022-06-30: 2 [IU] via SUBCUTANEOUS
  Administered 2022-07-01: 5 [IU] via SUBCUTANEOUS
  Administered 2022-07-01 (×2): 3 [IU] via SUBCUTANEOUS
  Administered 2022-07-01: 5 [IU] via SUBCUTANEOUS

## 2022-06-29 MED ORDER — VENLAFAXINE HCL ER 150 MG PO CP24
150.0000 mg | ORAL_CAPSULE | Freq: Every day | ORAL | Status: DC
  Administered 2022-06-29 – 2022-07-02 (×4): 150 mg via ORAL
  Filled 2022-06-29 (×4): qty 1

## 2022-06-29 MED ORDER — ONDANSETRON HCL 4 MG/2ML IJ SOLN
4.0000 mg | Freq: Four times a day (QID) | INTRAMUSCULAR | Status: DC | PRN
  Administered 2022-06-29: 4 mg via INTRAVENOUS
  Filled 2022-06-29: qty 2

## 2022-06-29 MED ORDER — ONDANSETRON HCL 4 MG PO TABS: 4.0000 mg | ORAL_TABLET | Freq: Four times a day (QID) | ORAL | Status: DC | PRN

## 2022-06-29 MED ORDER — GABAPENTIN 300 MG PO CAPS
600.0000 mg | ORAL_CAPSULE | Freq: Every day | ORAL | Status: DC
  Administered 2022-06-29 – 2022-07-02 (×4): 600 mg via ORAL
  Filled 2022-06-29 (×4): qty 2

## 2022-06-29 MED ORDER — LISINOPRIL 5 MG PO TABS
2.5000 mg | ORAL_TABLET | Freq: Every morning | ORAL | Status: DC
  Administered 2022-06-30: 2.5 mg via ORAL
  Filled 2022-06-29: qty 1

## 2022-06-29 MED ORDER — ACETAMINOPHEN 325 MG PO TABS: 650.0000 mg | ORAL_TABLET | Freq: Four times a day (QID) | ORAL | Status: DC | PRN

## 2022-06-29 MED ORDER — MUPIROCIN 2 % EX OINT
1.0000 | TOPICAL_OINTMENT | Freq: Two times a day (BID) | CUTANEOUS | Status: DC
  Administered 2022-06-29 – 2022-07-03 (×8): 1 via NASAL
  Filled 2022-06-29: qty 22

## 2022-06-29 MED ORDER — LORATADINE 10 MG PO TABS
10.0000 mg | ORAL_TABLET | Freq: Every day | ORAL | Status: DC
  Administered 2022-06-29 – 2022-07-03 (×5): 10 mg via ORAL
  Filled 2022-06-29 (×5): qty 1

## 2022-06-29 MED ORDER — HYDROMORPHONE HCL 1 MG/ML IJ SOLN
0.5000 mg | INTRAMUSCULAR | Status: DC | PRN
  Administered 2022-06-29 – 2022-06-30 (×8): 1 mg via INTRAVENOUS
  Filled 2022-06-29 (×8): qty 1

## 2022-06-29 MED ORDER — ACETAMINOPHEN 650 MG RE SUPP: 650.0000 mg | Freq: Four times a day (QID) | RECTAL | Status: DC | PRN

## 2022-06-29 MED ORDER — PANTOPRAZOLE SODIUM 40 MG PO TBEC
40.0000 mg | DELAYED_RELEASE_TABLET | Freq: Every day | ORAL | Status: DC
  Administered 2022-06-30 – 2022-07-03 (×4): 40 mg via ORAL
  Filled 2022-06-29 (×4): qty 1

## 2022-06-29 MED ORDER — SODIUM CHLORIDE 0.9 % IV SOLN
INTRAVENOUS | Status: AC
Start: 2022-06-29 — End: 2022-06-30

## 2022-06-29 NOTE — H&P (Signed)
History and Physical    Denise Hartman:[id number] DOB: [birth year] DOA: 06/29/2022  PCP: Tommie Sams, DO   Patient coming from: [facility]  Chief Complaint: Fall with R hip pain  HPI: Denise Hartman is a 66 y.o. female with medical history significant for type 2 diabetes, hypertension, anxiety, and GERD who was brought to the ED via EMS today after she had a fall at her church in [place].  She stumbled over one of the steps and fell on her right side and started to complain of right-sided hip pain and had deformity to her right hip.  She did not hit her head or lose consciousness and is not on any blood thinners.  She states that her pain is currently well controlled with IV medications.   ED Course: Vital signs stable and laboratory data within normal limits.  Chest x-ray with no acute findings.  Pelvic films with right femur fracture comminuted and displaced noted.  She has been given some IV fluids and pain medications.  EDP spoke with orthopedics, Dr. Charlann Boxer who agrees with further evaluation and management at [facility] long and requests transfer.  Review of Systems: Reviewed as noted above, otherwise negative.  Past Medical History:  Diagnosis Date   Diabetes mellitus    Diverticulitis    Hypertension     Past Surgical History:  Procedure Laterality Date   ABDOMINAL HYSTERECTOMY     CHOLECYSTECTOMY     KNEE SURGERY     TONSILLECTOMY       reports that she has never smoked. She does not have any smokeless tobacco history on file. She reports that she does not drink alcohol and does not use drugs.  Allergies  Allergen Reactions   Shellfish Allergy Anaphylaxis   Oxycodone Itching   Penicillins Hives and Itching   Sulfa Antibiotics     Family History  Problem Relation Age of Onset   Heart attack Mother    Heart attack Father     Prior to Admission medications   Medication Sig Start Date End Date Taking? Authorizing Provider  acetaminophen (TYLENOL) 500 MG tablet Take 500  mg by mouth every 6 (six) hours as needed. For pain   Yes [provider]  ARTIFICIAL TEAR OP Apply 1 drop to eye as needed. For dry eyes   Yes [provider]  cetirizine (ZYRTEC) 10 MG tablet Take by mouth.   Yes [provider]  gabapentin (NEURONTIN) 300 MG capsule TAKE 2 CAPSULES BY MOUTH NIGHTLY 06/26/22  Yes Cook, Jayce G, DO  lisinopril (ZESTRIL) 2.5 MG tablet Take 1 tablet by mouth every morning. 08/14/20  Yes [provider]  metFORMIN (GLUCOPHAGE) 500 MG tablet Take 2 tablets (1,000 mg total) by mouth 2 (two) times daily. 03/05/22  Yes Cook, Jayce G, DO  Omeprazole 20 MG TBEC Take 20 mg by mouth. 08/14/20  Yes [provider]  pioglitazone (ACTOS) 30 MG tablet Take 30 mg by mouth daily. 02/10/22  Yes [provider]  venlafaxine XR (EFFEXOR-XR) 75 MG 24 hr capsule Take 2 capsules (150 mg total) by mouth at bedtime. 03/03/22  Yes Cook, Jayce G, DO  Continuous Blood Gluc Receiver (FREESTYLE LIBRE 2 READER) DEVI Use as directed to check blood glucose. Patient not taking: Reported on 06/29/2022 06/26/22   Tommie Sams, DO  Continuous Blood Gluc Sensor (FREESTYLE LIBRE 2 SENSOR) MISC Apply as directed to check blood glucose. Change every 14 days. Patient not taking: Reported on 06/29/2022 06/26/22  Cook, Jayce G, DO  Dulaglutide (TRULICITY) 0.75 MG/0.5ML SOPN Inject 0.75 mg into the skin once a week. Patient not taking: Reported on 06/29/2022 03/25/22   Tommie Sams, DO    Physical Exam: Vitals:   06/29/22 1230 06/29/22 1330 06/29/22 1400 06/29/22 1430  BP: (!) 115/54 (!) 128/57 121/62 (!) 127/58  Pulse: 74 76 75 83  Resp:   13 (!) 21  Temp:      TempSrc:      SpO2: 98% 99% 94% 92%  Weight:      Height:        Constitutional: NAD, calm, comfortable Vitals:   06/29/22 1230 06/29/22 1330 06/29/22 1400 06/29/22 1430  BP: (!) 115/54 (!) 128/57 121/62 (!) 127/58  Pulse: 74 76 75 83  Resp:   13 (!) 21  Temp:      TempSrc:       SpO2: 98% 99% 94% 92%  Weight:      Height:       Eyes: lids and conjunctivae normal Neck: normal, supple Respiratory: clear to auscultation bilaterally. Normal respiratory effort. No accessory muscle use.  Cardiovascular: Regular rate and rhythm, no murmurs. Abdomen: no tenderness, no distention. Bowel sounds positive.  Musculoskeletal:  No edema. Skin: no rashes, lesions, ulcers.  Psychiatric: Flat affect  Labs on Admission: I have personally reviewed following labs and imaging studies  CBC: Recent Labs  Lab 06/29/22 1335  WBC 7.8  NEUTROABS 4.8  HGB 12.6  HCT 39.0  MCV 89.2  PLT 163   Basic Metabolic Panel: Recent Labs  Lab 06/29/22 1335  NA 136  K 3.8  CL 98  CO2 25  GLUCOSE 165*  BUN 22  CREATININE 0.65  CALCIUM 9.8   GFR: Estimated Creatinine Clearance: 78.1 mL/min (by C-G formula based on SCr of 0.65 mg/dL). Liver Function Tests: Recent Labs  Lab 06/29/22 1335  AST 39  ALT 29  ALKPHOS 88  BILITOT 1.0  PROT 6.9  ALBUMIN 3.7   No results for input(s): "LIPASE", "AMYLASE" in the last 168 hours. No results for input(s): "AMMONIA" in the last 168 hours. Coagulation Profile: No results for input(s): "INR", "PROTIME" in the last 168 hours. Cardiac Enzymes: No results for input(s): "CKTOTAL", "CKMB", "CKMBINDEX", "TROPONINI" in the last 168 hours. BNP (last 3 results) No results for input(s): "PROBNP" in the last 8760 hours. HbA1C: No results for input(s): "HGBA1C" in the last 72 hours. CBG: No results for input(s): "GLUCAP" in the last 168 hours. Lipid Profile: No results for input(s): "CHOL", "HDL", "LDLCALC", "TRIG", "CHOLHDL", "LDLDIRECT" in the last 72 hours. Thyroid Function Tests: No results for input(s): "TSH", "T4TOTAL", "FREET4", "T3FREE", "THYROIDAB" in the last 72 hours. Anemia Panel: No results for input(s): "VITAMINB12", "FOLATE", "FERRITIN", "TIBC", "IRON", "RETICCTPCT" in the last 72 hours. Urine analysis:    Component Value  Date/Time   COLORURINE YELLOW 09/21/2011 1508   APPEARANCEUR CLOUDY (A) 09/21/2011 1508   LABSPEC 1.025 09/21/2011 1508   PHURINE 5.5 09/21/2011 1508   GLUCOSEU NEGATIVE 09/21/2011 1508   HGBUR TRACE (A) 09/21/2011 1508   BILIRUBINUR ++ 12/01/2012 1334   KETONESUR NEGATIVE 09/21/2011 1508   PROTEINUR NEGATIVE 09/21/2011 1508   UROBILINOGEN 0.2 09/21/2011 1508   NITRITE NEGATIVE 09/21/2011 1508   LEUKOCYTESUR SMALL (A) 09/21/2011 1508    Radiological Exams on Admission: DG Chest Port 1 View  Result Date: 06/29/2022 CLINICAL DATA:  Fall, hip fracture EXAM: PORTABLE CHEST 1 VIEW COMPARISON:  11/13/2021 FINDINGS: The heart size and mediastinal contours  are within normal limits. Both lungs are clear. The visualized skeletal structures are unremarkable. IMPRESSION: No active disease. Electronically Signed   By: Duanne Guess D.O.   On: 06/29/2022 13:30   DG Hip Unilat  With Pelvis 2-3 Views Right  Result Date: 06/29/2022 CLINICAL DATA:  Trauma, fall EXAM: DG HIP (WITH OR WITHOUT PELVIS) 2-3V RIGHT COMPARISON:  None Available. FINDINGS: Subcapital fracture is seen in the neck of right femur. Lays over riding of fracture fragments. There is no dislocation. IMPRESSION: Displaced comminuted fracture is seen in the neck of right femur. Electronically Signed   By: Ernie Avena M.D.   On: 06/29/2022 11:40    EKG: Independently reviewed. SR 75bpm, low voltage.  Assessment/Plan Principal Problem:   Hip fracture (HCC) Active Problems:   Depression with anxiety   Gastroesophageal reflux disease without esophagitis   Hypertension   Mixed hyperlipidemia   Overweight with body mass index (BMI) of 28 to 28.9 in adult   Type 2 diabetes mellitus with diabetic polyneuropathy, without long-term current use of insulin (HCC)    Acute right displaced/comminuted hip fracture -Secondary to mechanical fall -Pain management -Fall precautions -EDP spoke with orthopedics, Dr. Charlann Boxer who request  transfer to [facility] -N.p.o. except medications  Type 2 diabetes with neuropathy and mild hyperglycemia -SSI  Anxiety -Continue home medications  Hypertension -Continue lisinopril  GERD -PPI   DVT prophylaxis: Lovenox Code Status: Full Family Communication: Sister at bedside Disposition Plan:Admit for hip fracture repair Consults called:EDP spoke with Dr. Charlann Boxer, requesting transfer to [facility] Admission status: Inpatient, MedSurg  Severity of Illness: The appropriate patient status for this patient is INPATIENT. Inpatient status is judged to be reasonable and necessary in order to provide the required intensity of service to ensure the patient's safety. The patient's presenting symptoms, physical exam findings, and initial radiographic and laboratory data in the context of their chronic comorbidities is felt to place them at high risk for further clinical deterioration. Furthermore, it is not anticipated that the patient will be medically stable for discharge from the hospital within 2 midnights of admission.   * I certify that at the point of admission it is my clinical judgment that the patient will require inpatient hospital care spanning beyond 2 midnights from the point of admission due to high intensity of service, high risk for further deterioration and high frequency of surveillance required.*   Aleza Pew D Regis Hinton DO [facility]  If 7PM-7AM, please contact night-coverage [email or web address]  06/29/2022, 2:46 PM

## 2022-06-29 NOTE — ED Triage Notes (Signed)
Pt arrived ED via [facility] from church in [place] with c/o a fall from church steps. Pt fell sideways and landed on right hip. 22 G IV in left AC. 10mg  of morphine given to pt in route.

## 2022-06-29 NOTE — ED Notes (Signed)
[facility] called at this time to setup transport.  

## 2022-06-29 NOTE — ED Provider Notes (Addendum)
[facility] EMERGENCY DEPARTMENT Provider Note   CSN: [id number] Arrival date & time: 06/29/22  1106     History  Chief Complaint  Patient presents with   Hip Pain    Denise Hartman is a 66 y.o. female.  Patient brought in by EMS patient had a fall at church in [place].  [place] of [place].  Larey Seat on her right side complaining of right hip pain.  And deformity to right hip.  No other injuries.  Did not hit head no loss of consciousness.  Patient's not on blood thinners.  Past medical history significant for diabetes diverticulitis and hypertension past surgical history significant for gallbladder removal and abdominal hysterectomy.  Patient never used tobacco products.       Home Medications Prior to Admission medications   Medication Sig Start Date End Date Taking? Authorizing Provider  acetaminophen (TYLENOL) 500 MG tablet Take 500 mg by mouth every 6 (six) hours as needed. For pain   Yes [provider]  ARTIFICIAL TEAR OP Apply 1 drop to eye as needed. For dry eyes   Yes [provider]  cetirizine (ZYRTEC) 10 MG tablet Take by mouth.   Yes [provider]  gabapentin (NEURONTIN) 300 MG capsule TAKE 2 CAPSULES BY MOUTH NIGHTLY 06/26/22  Yes Cook, Jayce G, DO  lisinopril (ZESTRIL) 2.5 MG tablet Take 1 tablet by mouth every morning. 08/14/20  Yes [provider]  metFORMIN (GLUCOPHAGE) 500 MG tablet Take 2 tablets (1,000 mg total) by mouth 2 (two) times daily. 03/05/22  Yes Cook, Jayce G, DO  Omeprazole 20 MG TBEC Take 20 mg by mouth. 08/14/20  Yes [provider]  pioglitazone (ACTOS) 30 MG tablet Take 30 mg by mouth daily. 02/10/22  Yes [provider]  venlafaxine XR (EFFEXOR-XR) 75 MG 24 hr capsule Take 2 capsules (150 mg total) by mouth at bedtime. 03/03/22  Yes Cook, Jayce G, DO  Continuous Blood Gluc Receiver (FREESTYLE LIBRE 2 READER) DEVI Use as directed to check blood glucose. Patient not  taking: Reported on 06/29/2022 06/26/22   Tommie Sams, DO  Continuous Blood Gluc Sensor (FREESTYLE LIBRE 2 SENSOR) MISC Apply as directed to check blood glucose. Change every 14 days. Patient not taking: Reported on 06/29/2022 06/26/22   Tommie Sams, DO  Dulaglutide (TRULICITY) 0.75 MG/0.5ML SOPN Inject 0.75 mg into the skin once a week. Patient not taking: Reported on 06/29/2022 03/25/22   Tommie Sams, DO      Allergies    Shellfish allergy, Oxycodone, Penicillins, and Sulfa antibiotics    Review of Systems   Review of Systems  Constitutional:  Negative for chills and fever.  HENT:  Negative for rhinorrhea and sore throat.   Eyes:  Negative for visual disturbance.  Respiratory:  Negative for cough and shortness of breath.   Cardiovascular:  Negative for chest pain and leg swelling.  Gastrointestinal:  Negative for abdominal pain, diarrhea, nausea and vomiting.  Genitourinary:  Negative for dysuria.  Musculoskeletal:  Negative for back pain and neck pain.  Skin:  Negative for rash.  Neurological:  Negative for dizziness, light-headedness and headaches.  Hematological:  Does not bruise/bleed easily.  Psychiatric/Behavioral:  Negative for confusion.     Physical Exam Updated Vital Signs BP 121/62   Pulse 75   Temp 98.3 F (36.8 C) (Oral)   Resp 13   Ht 1.702 m (5\' 7" )   Wt 81.6 kg   SpO2 94%  BMI 28.19 kg/m  Physical Exam Vitals and nursing note reviewed.  Constitutional:      General: She is not in acute distress.    Appearance: Normal appearance. She is well-developed.  HENT:     Head: Normocephalic and atraumatic.     Mouth/Throat:     Mouth: Mucous membranes are moist.  Eyes:     Extraocular Movements: Extraocular movements intact.     Conjunctiva/sclera: Conjunctivae normal.     Pupils: Pupils are equal, round, and reactive to light.  Cardiovascular:     Rate and Rhythm: Normal rate and regular rhythm.     Heart sounds: No murmur heard. Pulmonary:      Effort: Pulmonary effort is normal. No respiratory distress.     Breath sounds: Normal breath sounds.  Abdominal:     Palpations: Abdomen is soft.     Tenderness: There is no abdominal tenderness.  Musculoskeletal:        General: Tenderness and deformity present. No swelling.     Cervical back: Normal range of motion and neck supple.     Comments: Tenderness to right hip area with deformity.  Leg is shortened.  Dorsalis pedis pulses 2+ to the foot.  Sensation intact.  Skin:    General: Skin is warm and dry.     Capillary Refill: Capillary refill takes less than 2 seconds.  Neurological:     General: No focal deficit present.     Mental Status: She is alert and oriented to person, place, and time.     Cranial Nerves: No cranial nerve deficit.     Sensory: No sensory deficit.     Motor: No weakness.  Psychiatric:        Mood and Affect: Mood normal.     ED Results / Procedures / Treatments   Labs (all labs ordered are listed, but only abnormal results are displayed) Labs Reviewed  COMPREHENSIVE METABOLIC PANEL - Abnormal; Notable for the following components:      Result Value   Glucose, Bld 165 (*)    All other components within normal limits  CBC WITH DIFFERENTIAL/PLATELET    EKG EKG Interpretation  Date/Time:  Sunday June 29 2022 13:33:13 EST Ventricular Rate:  75 PR Interval:  137 QRS Duration: 90 QT Interval:  418 QTC Calculation: 467 R Axis:   -30 Text Interpretation: Sinus rhythm Left axis deviation Low voltage, precordial leads Abnormal R-wave progression, late transition Confirmed by Vanetta Mulders [phone]) on 06/29/2022 1:44:48 PM  Radiology DG Chest Port 1 View  Result Date: 06/29/2022 CLINICAL DATA:  Fall, hip fracture EXAM: PORTABLE CHEST 1 VIEW COMPARISON:  11/13/2021 FINDINGS: The heart size and mediastinal contours are within normal limits. Both lungs are clear. The visualized skeletal structures are unremarkable. IMPRESSION: No active disease.  Electronically Signed   By: Duanne Guess D.O.   On: 06/29/2022 13:30   DG Hip Unilat  With Pelvis 2-3 Views Right  Result Date: 06/29/2022 CLINICAL DATA:  Trauma, fall EXAM: DG HIP (WITH OR WITHOUT PELVIS) 2-3V RIGHT COMPARISON:  None Available. FINDINGS: Subcapital fracture is seen in the neck of right femur. Lays over riding of fracture fragments. There is no dislocation. IMPRESSION: Displaced comminuted fracture is seen in the neck of right femur. Electronically Signed   By: Ernie Avena M.D.   On: 06/29/2022 11:40    Procedures Procedures    Medications Ordered in ED Medications  0.9 %  sodium chloride infusion ( Intravenous New Bag/Given 06/29/22 1337)  ondansetron (  ZOFRAN) injection 4 mg (has no administration in time range)  HYDROmorphone (DILAUDID) injection 1 mg (has no administration in time range)    ED Course/ Medical Decision Making/ A&P                           Medical Decision Making Amount and/or Complexity of Data Reviewed Labs: ordered. Radiology: ordered.  Risk Prescription drug management. Decision regarding hospitalization.   X-ray consistent with a right hip fracture.  Comminuted subcapital.  Chest x-ray negative.  Reoperative labs pending and ordered.  Patient's EKG sinus rhythm with a rate of 75.  Contact orthopedics on call.  Patient will probably need admission by the hospitalist service that probably may mean admission to [facility]  We will see where the orthopedic surgeon wants her admitted.  Patient treated with pain medicine.  We will start IV fluids.  Discussed with Dr. Charlann Boxer orthopedics wants patient admitted to [facility].  CBC without any acute abnormalities hemoglobin 12.6 platelets 163 complete metabolic panel glucose 165 liver function test normal.   Final Clinical Impression(s) / ED Diagnoses Final diagnoses:  Closed fracture of right hip, initial encounter [facility])    Rx / DC Orders ED Discharge Orders     None          Vanetta Mulders, MD 06/29/22 1344    Vanetta Mulders, MD 06/29/22 1438

## 2022-06-30 ENCOUNTER — Other Ambulatory Visit: Payer: Self-pay

## 2022-06-30 ENCOUNTER — Encounter (HOSPITAL_COMMUNITY): Payer: Self-pay | Admitting: Internal Medicine

## 2022-06-30 ENCOUNTER — Inpatient Hospital Stay (HOSPITAL_COMMUNITY): Payer: 59

## 2022-06-30 ENCOUNTER — Inpatient Hospital Stay (HOSPITAL_COMMUNITY): Payer: 59 | Admitting: Anesthesiology

## 2022-06-30 ENCOUNTER — Encounter (HOSPITAL_COMMUNITY)
Admission: EM | Disposition: A | Discharge status: Home Health | Payer: Self-pay | Source: Home / Self Care | Attending: Internal Medicine

## 2022-06-30 DIAGNOSIS — S72001A Fracture of unspecified part of neck of right femur, initial encounter for closed fracture: Secondary | ICD-10-CM | POA: Diagnosis not present

## 2022-06-30 DIAGNOSIS — Z96641 Presence of right artificial hip joint: Secondary | ICD-10-CM

## 2022-06-30 HISTORY — PX: TOTAL HIP ARTHROPLASTY: SHX124

## 2022-06-30 LAB — MAGNESIUM: Magnesium: 1.4 mg/dL — ABNORMAL LOW (ref 1.7–2.4)

## 2022-06-30 LAB — HIV ANTIBODY (ROUTINE TESTING W REFLEX): HIV Screen 4th Generation wRfx: NONREACTIVE

## 2022-06-30 LAB — GLUCOSE, CAPILLARY
Glucose-Capillary: 153 mg/dL — ABNORMAL HIGH (ref 70–99)
Glucose-Capillary: 149 mg/dL — ABNORMAL HIGH (ref 70–99)
Glucose-Capillary: 136 mg/dL — ABNORMAL HIGH (ref 70–99)
Glucose-Capillary: 139 mg/dL — ABNORMAL HIGH (ref 70–99)
Glucose-Capillary: 175 mg/dL — ABNORMAL HIGH (ref 70–99)
Glucose-Capillary: 338 mg/dL — ABNORMAL HIGH (ref 70–99)

## 2022-06-30 LAB — BASIC METABOLIC PANEL
Anion gap: 6 (ref 5–15)
BUN: 18 mg/dL (ref 8–23)
CO2: 31 mmol/L (ref 22–32)
Calcium: 9 mg/dL (ref 8.9–10.3)
Chloride: 98 mmol/L (ref 98–111)
Creatinine, Ser: 0.6 mg/dL (ref 0.44–1.00)
GFR, Estimated: >60 mL/min (ref >60)
Glucose, Bld: 187 mg/dL — ABNORMAL HIGH (ref 70–99)
Potassium: 4 mmol/L (ref 3.5–5.1)
Sodium: 135 mmol/L (ref 135–145)

## 2022-06-30 LAB — CBC
HCT: 40 % (ref 36.0–46.0)
Hemoglobin: 12.8 g/dL (ref 12.0–15.0)
MCH: 29.2 pg (ref 26.0–34.0)
MCHC: 32 g/dL (ref 30.0–36.0)
MCV: 91.3 fL (ref 80.0–100.0)
Platelets: 170 10*3/uL (ref 150–400)
RBC: 4.38 MIL/uL (ref 3.87–5.11)
RDW: 13.3 % (ref 11.5–15.5)
WBC: 6.1 10*3/uL (ref 4.0–10.5)
nRBC: 0 % (ref 0.0–0.2)

## 2022-06-30 SURGERY — ARTHROPLASTY, HIP, TOTAL, ANTERIOR APPROACH
Anesthesia: General | Site: Hip | Laterality: Right

## 2022-06-30 MED ORDER — ONDANSETRON HCL 4 MG PO TABS: 4.0000 mg | ORAL_TABLET | Freq: Four times a day (QID) | ORAL | Status: DC | PRN

## 2022-06-30 MED ORDER — HYDROCODONE-ACETAMINOPHEN 7.5-325 MG PO TABS
1.0000 | ORAL_TABLET | ORAL | Status: DC | PRN
  Administered 2022-07-01 – 2022-07-03 (×6): 2 via ORAL
  Filled 2022-06-30 (×6): qty 2

## 2022-06-30 MED ORDER — ACETAMINOPHEN 10 MG/ML IV SOLN
1000.0000 mg | Freq: Once | INTRAVENOUS | Status: AC
  Administered 2022-06-30: 1000 mg via INTRAVENOUS
  Filled 2022-06-30: qty 100

## 2022-06-30 MED ORDER — PROPOFOL 10 MG/ML IV BOLUS
INTRAVENOUS | Status: DC | PRN
  Administered 2022-06-30: 150 mg via INTRAVENOUS

## 2022-06-30 MED ORDER — ASPIRIN 81 MG PO CHEW
81.0000 mg | CHEWABLE_TABLET | Freq: Two times a day (BID) | ORAL | Status: DC
  Administered 2022-06-30 – 2022-07-03 (×6): 81 mg via ORAL
  Filled 2022-06-30 (×6): qty 1

## 2022-06-30 MED ORDER — PROPOFOL 10 MG/ML IV BOLUS
INTRAVENOUS | Status: AC
  Filled 2022-06-30: qty 20

## 2022-06-30 MED ORDER — POVIDONE-IODINE 10 % EX SWAB: 2.0000 | Freq: Once | CUTANEOUS | Status: DC

## 2022-06-30 MED ORDER — DEXAMETHASONE SODIUM PHOSPHATE 10 MG/ML IJ SOLN
10.0000 mg | Freq: Once | INTRAMUSCULAR | Status: AC
  Administered 2022-07-01: 10 mg via INTRAVENOUS
  Filled 2022-06-30: qty 1

## 2022-06-30 MED ORDER — PHENYLEPHRINE HCL (PRESSORS) 10 MG/ML IV SOLN
INTRAVENOUS | Status: AC
  Filled 2022-06-30: qty 1

## 2022-06-30 MED ORDER — ACETAMINOPHEN 500 MG PO TABS
1000.0000 mg | ORAL_TABLET | Freq: Once | ORAL | Status: DC
  Filled 2022-06-30: qty 2

## 2022-06-30 MED ORDER — DOCUSATE SODIUM 100 MG PO CAPS
100.0000 mg | ORAL_CAPSULE | Freq: Two times a day (BID) | ORAL | Status: DC
  Administered 2022-06-30 – 2022-07-03 (×6): 100 mg via ORAL
  Filled 2022-06-30 (×6): qty 1

## 2022-06-30 MED ORDER — LACTATED RINGERS IV SOLN: INTRAVENOUS | Status: DC

## 2022-06-30 MED ORDER — LIDOCAINE 2% (20 MG/ML) 5 ML SYRINGE
INTRAMUSCULAR | Status: DC | PRN
  Administered 2022-06-30: 60 mg via INTRAVENOUS

## 2022-06-30 MED ORDER — BUPIVACAINE-EPINEPHRINE (PF) 0.25% -1:200000 IJ SOLN
INTRAMUSCULAR | Status: AC
  Filled 2022-06-30: qty 30

## 2022-06-30 MED ORDER — KETOROLAC TROMETHAMINE 30 MG/ML IJ SOLN
INTRAMUSCULAR | Status: DC | PRN
  Administered 2022-06-30: 30 mg via INTRAMUSCULAR

## 2022-06-30 MED ORDER — 0.9 % SODIUM CHLORIDE (POUR BTL) OPTIME
TOPICAL | Status: DC | PRN
  Administered 2022-06-30: 1000 mL

## 2022-06-30 MED ORDER — METOCLOPRAMIDE HCL 5 MG/ML IJ SOLN: 5.0000 mg | Freq: Three times a day (TID) | INTRAMUSCULAR | Status: DC | PRN

## 2022-06-30 MED ORDER — SODIUM CHLORIDE (PF) 0.9 % IJ SOLN
INTRAMUSCULAR | Status: DC | PRN
  Administered 2022-06-30: 30 mL

## 2022-06-30 MED ORDER — METHOCARBAMOL 500 MG PO TABS
500.0000 mg | ORAL_TABLET | Freq: Four times a day (QID) | ORAL | Status: DC | PRN
  Administered 2022-07-02 – 2022-07-03 (×2): 500 mg via ORAL
  Filled 2022-06-30 (×2): qty 1

## 2022-06-30 MED ORDER — ONDANSETRON HCL 4 MG/2ML IJ SOLN: 4.0000 mg | Freq: Four times a day (QID) | INTRAMUSCULAR | Status: DC | PRN

## 2022-06-30 MED ORDER — PHENYLEPHRINE HCL-NACL 20-0.9 MG/250ML-% IV SOLN
INTRAVENOUS | Status: DC | PRN
  Administered 2022-06-30: 80 ug/min via INTRAVENOUS

## 2022-06-30 MED ORDER — MIDAZOLAM HCL 5 MG/5ML IJ SOLN
INTRAMUSCULAR | Status: DC | PRN
  Administered 2022-06-30: 2 mg via INTRAVENOUS

## 2022-06-30 MED ORDER — PHENYLEPHRINE 80 MCG/ML (10ML) SYRINGE FOR IV PUSH (FOR BLOOD PRESSURE SUPPORT)
PREFILLED_SYRINGE | INTRAVENOUS | Status: DC | PRN
  Administered 2022-06-30: 160 ug via INTRAVENOUS
  Administered 2022-06-30 (×2): 80 ug via INTRAVENOUS
  Administered 2022-06-30 (×2): 160 ug via INTRAVENOUS

## 2022-06-30 MED ORDER — METOCLOPRAMIDE HCL 5 MG PO TABS: 5.0000 mg | ORAL_TABLET | Freq: Three times a day (TID) | ORAL | Status: DC | PRN

## 2022-06-30 MED ORDER — SODIUM CHLORIDE (PF) 0.9 % IJ SOLN
INTRAMUSCULAR | Status: AC
  Filled 2022-06-30: qty 50

## 2022-06-30 MED ORDER — SODIUM CHLORIDE 0.9 % IV SOLN: INTRAVENOUS | Status: DC

## 2022-06-30 MED ORDER — DEXAMETHASONE SODIUM PHOSPHATE 10 MG/ML IJ SOLN
INTRAMUSCULAR | Status: DC | PRN
  Administered 2022-06-30: 4 mg via INTRAVENOUS

## 2022-06-30 MED ORDER — KETOROLAC TROMETHAMINE 30 MG/ML IJ SOLN
INTRAMUSCULAR | Status: AC
  Filled 2022-06-30: qty 1

## 2022-06-30 MED ORDER — CHLORHEXIDINE GLUCONATE 0.12 % MT SOLN
15.0000 mL | Freq: Once | OROMUCOSAL | Status: AC
  Administered 2022-06-30: 15 mL via OROMUCOSAL

## 2022-06-30 MED ORDER — LIDOCAINE HCL (PF) 2 % IJ SOLN
INTRAMUSCULAR | Status: AC
  Filled 2022-06-30: qty 5

## 2022-06-30 MED ORDER — ROCURONIUM BROMIDE 10 MG/ML (PF) SYRINGE
PREFILLED_SYRINGE | INTRAVENOUS | Status: DC | PRN
  Administered 2022-06-30: 50 mg via INTRAVENOUS
  Administered 2022-06-30: 10 mg via INTRAVENOUS

## 2022-06-30 MED ORDER — CEFAZOLIN SODIUM-DEXTROSE 2-4 GM/100ML-% IV SOLN
2.0000 g | Freq: Four times a day (QID) | INTRAVENOUS | Status: AC
  Administered 2022-06-30 – 2022-07-01 (×2): 2 g via INTRAVENOUS
  Filled 2022-06-30 (×2): qty 100

## 2022-06-30 MED ORDER — MORPHINE SULFATE (PF) 2 MG/ML IV SOLN: 0.5000 mg | INTRAVENOUS | Status: DC | PRN

## 2022-06-30 MED ORDER — PHENOL 1.4 % MT LIQD: 1.0000 | OROMUCOSAL | Status: DC | PRN

## 2022-06-30 MED ORDER — DIPHENHYDRAMINE HCL 12.5 MG/5ML PO ELIX: 12.5000 mg | ORAL_SOLUTION | ORAL | Status: DC | PRN

## 2022-06-30 MED ORDER — BUPIVACAINE-EPINEPHRINE (PF) 0.25% -1:200000 IJ SOLN
INTRAMUSCULAR | Status: DC | PRN
  Administered 2022-06-30: 30 mL

## 2022-06-30 MED ORDER — FENTANYL CITRATE (PF) 250 MCG/5ML IJ SOLN
INTRAMUSCULAR | Status: AC
  Filled 2022-06-30: qty 5

## 2022-06-30 MED ORDER — MENTHOL 3 MG MT LOZG: 1.0000 | LOZENGE | OROMUCOSAL | Status: DC | PRN

## 2022-06-30 MED ORDER — STERILE WATER FOR IRRIGATION IR SOLN
Status: DC | PRN
  Administered 2022-06-30: 2000 mL

## 2022-06-30 MED ORDER — ROCURONIUM BROMIDE 10 MG/ML (PF) SYRINGE
PREFILLED_SYRINGE | INTRAVENOUS | Status: AC
  Filled 2022-06-30: qty 10

## 2022-06-30 MED ORDER — ONDANSETRON HCL 4 MG/2ML IJ SOLN
INTRAMUSCULAR | Status: DC | PRN
  Administered 2022-06-30: 4 mg via INTRAVENOUS

## 2022-06-30 MED ORDER — TRANEXAMIC ACID-NACL 1000-0.7 MG/100ML-% IV SOLN
1000.0000 mg | INTRAVENOUS | Status: AC
  Administered 2022-06-30: 1000 mg via INTRAVENOUS
  Filled 2022-06-30: qty 100

## 2022-06-30 MED ORDER — CEFAZOLIN SODIUM-DEXTROSE 2-4 GM/100ML-% IV SOLN
2.0000 g | INTRAVENOUS | Status: AC
  Administered 2022-06-30: 2 g via INTRAVENOUS
  Filled 2022-06-30: qty 100

## 2022-06-30 MED ORDER — SUGAMMADEX SODIUM 200 MG/2ML IV SOLN
INTRAVENOUS | Status: DC | PRN
  Administered 2022-06-30: 200 mg via INTRAVENOUS

## 2022-06-30 MED ORDER — HYDROCODONE-ACETAMINOPHEN 5-325 MG PO TABS
1.0000 | ORAL_TABLET | ORAL | Status: DC | PRN
  Administered 2022-07-01: 2 via ORAL
  Filled 2022-06-30: qty 2

## 2022-06-30 MED ORDER — POLYETHYLENE GLYCOL 3350 17 G PO PACK
17.0000 g | PACK | Freq: Two times a day (BID) | ORAL | Status: DC
  Administered 2022-06-30 – 2022-07-03 (×6): 17 g via ORAL
  Filled 2022-06-30 (×6): qty 1

## 2022-06-30 MED ORDER — FENTANYL CITRATE PF 50 MCG/ML IJ SOSY: 25.0000 ug | PREFILLED_SYRINGE | INTRAMUSCULAR | Status: DC | PRN

## 2022-06-30 MED ORDER — CHLORHEXIDINE GLUCONATE 4 % EX LIQD
60.0000 mL | Freq: Once | CUTANEOUS | Status: AC
  Administered 2022-06-30: 4 via TOPICAL

## 2022-06-30 MED ORDER — METHOCARBAMOL 1000 MG/10ML IJ SOLN: 500.0000 mg | Freq: Four times a day (QID) | INTRAVENOUS | Status: DC | PRN

## 2022-06-30 MED ORDER — PROMETHAZINE HCL 25 MG/ML IJ SOLN: 6.2500 mg | INTRAMUSCULAR | Status: DC | PRN

## 2022-06-30 MED ORDER — ACETAMINOPHEN 325 MG PO TABS: 325.0000 mg | ORAL_TABLET | Freq: Four times a day (QID) | ORAL | Status: DC | PRN

## 2022-06-30 MED ORDER — BISACODYL 10 MG RE SUPP: 10.0000 mg | Freq: Every day | RECTAL | Status: DC | PRN

## 2022-06-30 MED ORDER — MIDAZOLAM HCL 2 MG/2ML IJ SOLN
INTRAMUSCULAR | Status: AC
  Filled 2022-06-30: qty 2

## 2022-06-30 MED ORDER — FENTANYL CITRATE (PF) 100 MCG/2ML IJ SOLN
INTRAMUSCULAR | Status: DC | PRN
  Administered 2022-06-30 (×2): 50 ug via INTRAVENOUS
  Administered 2022-06-30: 100 ug via INTRAVENOUS

## 2022-06-30 MED ORDER — TRANEXAMIC ACID-NACL 1000-0.7 MG/100ML-% IV SOLN
1000.0000 mg | Freq: Once | INTRAVENOUS | Status: AC
  Administered 2022-06-30: 1000 mg via INTRAVENOUS
  Filled 2022-06-30: qty 100

## 2022-06-30 SURGICAL SUPPLY — 42 items
ADH SKN CLS APL DERMABOND .7 (GAUZE/BANDAGES/DRESSINGS) ×1
BAG COUNTER SPONGE SURGICOUNT (BAG) IMPLANT
BAG SPNG CNTER NS LX DISP (BAG) ×1
BLADE SAG 18X100X1.27 (BLADE) ×2 IMPLANT
COVER PERINEAL POST (MISCELLANEOUS) ×2 IMPLANT
COVER SURGICAL LIGHT HANDLE (MISCELLANEOUS) ×2 IMPLANT
CUP ACET PINNACLE SECTR 50MM (Hips) IMPLANT
DERMABOND ADVANCED .7 DNX12 (GAUZE/BANDAGES/DRESSINGS) ×2 IMPLANT
DRAPE FOOT SWITCH (DRAPES) ×2 IMPLANT
DRAPE STERI IOBAN 125X83 (DRAPES) ×2 IMPLANT
DRAPE U-SHAPE 47X51 STRL (DRAPES) ×4 IMPLANT
DRESSING AQUACEL AG SP 3.5X10 (GAUZE/BANDAGES/DRESSINGS) ×2 IMPLANT
DRSG AQUACEL AG ADV 3.5X10 (GAUZE/BANDAGES/DRESSINGS) IMPLANT
DRSG AQUACEL AG SP 3.5X10 (GAUZE/BANDAGES/DRESSINGS) ×1
DURAPREP 26ML APPLICATOR (WOUND CARE) ×2 IMPLANT
ELECT REM PT RETURN 15FT ADLT (MISCELLANEOUS) ×2 IMPLANT
FEM STEM 12/14 TAPER SZ 4 HIP (Orthopedic Implant) ×1 IMPLANT
FEMORAL STEM 12/14 TPR SZ4 HIP (Orthopedic Implant) IMPLANT
GLOVE BIO SURGEON STRL SZ 6 (GLOVE) ×2 IMPLANT
GLOVE BIOGEL PI IND STRL 6.5 (GLOVE) ×2 IMPLANT
GLOVE BIOGEL PI IND STRL 7.5 (GLOVE) ×2 IMPLANT
GLOVE ORTHO TXT STRL SZ7.5 (GLOVE) ×4 IMPLANT
GOWN STRL REUS W/ TWL LRG LVL3 (GOWN DISPOSABLE) ×4 IMPLANT
GOWN STRL REUS W/TWL LRG LVL3 (GOWN DISPOSABLE) ×2
HEAD FEMORAL 32 CERAMIC (Hips) IMPLANT
HOLDER FOLEY CATH W/STRAP (MISCELLANEOUS) ×2 IMPLANT
KIT TURNOVER KIT A (KITS) IMPLANT
LINER ACET PNNCL PLUS4 NEUTRAL (Hips) IMPLANT
PACK ANTERIOR HIP CUSTOM (KITS) ×2 IMPLANT
PINNACLE PLUS 4 NEUTRAL (Hips) ×1 IMPLANT
PINNACLE SECTOR CUP 50MM (Hips) ×1 IMPLANT
SCREW 6.5MMX30MM (Screw) IMPLANT
SUT MNCRL AB 4-0 PS2 18 (SUTURE) ×2 IMPLANT
SUT STRATAFIX 0 PDS 27 VIOLET (SUTURE) ×1
SUT VIC AB 1 CT1 36 (SUTURE) ×6 IMPLANT
SUT VIC AB 2-0 CT1 27 (SUTURE) ×2
SUT VIC AB 2-0 CT1 TAPERPNT 27 (SUTURE) ×4 IMPLANT
SUTURE STRATFX 0 PDS 27 VIOLET (SUTURE) ×2 IMPLANT
TRAY FOL W/BAG SLVR 16FR STRL (SET/KITS/TRAYS/PACK) IMPLANT
TRAY FOLEY W/BAG SLVR 16FR LF (SET/KITS/TRAYS/PACK) ×1
TUBE SUCTION HIGH CAP CLEAR NV (SUCTIONS) ×2 IMPLANT
WATER STERILE IRR 1000ML POUR (IV SOLUTION) ×2 IMPLANT

## 2022-06-30 NOTE — Progress Notes (Signed)
PROGRESS NOTE Denise Hartman  PRF:[id number] DOB: Aug 10, 1956 DOA: 06/29/2022 PCP: Tommie Sams, DO   Brief Narrative/Hospital Course: 66 year old female with T2DM HTN anxiety, GERD seen after a fall after a fall found to have right femur fracture comminuted and displaced, Dr. Chrissie Noa from orthopedic was discussed and transferred to [facility] hospital from [place] for operative intervention.  Subjective: Seen and examined.  Resting comfortably no apparent if she does not move. Denies chest pain nausea vomiting fever chills.  Assessment and Plan: Principal Problem:   Hip fracture (HCC) Active Problems:   Depression with anxiety   Gastroesophageal reflux disease without esophagitis   Hypertension   Mixed hyperlipidemia   Overweight with body mass index (BMI) of 28 to 28.9 in adult   Type 2 diabetes mellitus with diabetic polyneuropathy, without long-term current use of insulin (HCC)   Right femoral neck fracture secondary to fall: Dr.Olin has been consulted and planning for operative intervention. She has history of diabetes hypertension anxiety, but no known CAD, no hx of CVA,CHF,CKD,not on insulin-will cont operative intervention as per Ortho.  Type II diabetes mellitus: Hold home OHA.  Keep on sliding scale insulin.Monitor blood sugar as below although at risk for infection  in the setting of diabetes will optimize sugar with ssi here. Recent Labs  Lab 06/26/22 1354 06/29/22 2004 06/29/22 2357 06/30/22 0400 06/30/22 0746  GLUCAP  --  172* 175* 153* 149*  HGBA1C 9.6*  --   --   --   --     Hypertension: BP is well controlled.Continue home dose of lisinopril.  Monitor GERD: Continue PPI. Anxiety:Continue her Neurontin, Effexor  DVT prophylaxis: enoxaparin (LOVENOX) injection 40 mg Start: 06/29/22 1515.  Asked RN to hold Lovenox Code Status:   Code Status: Full Code Family Communication: plan of care discussed with patient at bedside. Patient status is: Inpatient because of a  fracture  Level of care: Med-Surg  Dispo: The patient is from: home            Anticipated disposition: snf vs home Mobility Assessment (last 72 hours)     Mobility Assessment     Row Name 06/30/22 0809 06/29/22 1800 06/29/22 17:49:23       Does patient have an order for bedrest or is patient medically unstable Yes- Bedfast (Level 1) - Complete Yes- Bedfast (Level 1) - Complete Yes- Bedfast (Level 1) - Complete               Objective: Vitals last 24 hrs: Vitals:   06/29/22 2131 06/30/22 0123 06/30/22 0541 06/30/22 0948  BP: (!) 118/52 129/63 (!) 136/59 128/68  Pulse: 69 72 76 83  Resp: 14 14 12 18   Temp: 98.1 F (36.7 C) 98 F (36.7 C) 98.7 F (37.1 C) 98.7 F (37.1 C)  TempSrc: Oral Oral Oral Oral  SpO2: 96% 97% 97% 93%  Weight:      Height:       Weight change:   Physical Examination: General exam: alert awake, older than stated age HEENT:Oral mucosa moist, Ear/Nose WNL grossly Respiratory system: bilaterally clear BS, no use of accessory muscle Cardiovascular system: S1 & S2 +, No JVD. Gastrointestinal system: Abdomen soft,NT,ND, BS+ Nervous System:Alert, awake, moving extremities. Extremities: LE edema neg, tender right,  rt leg is shortened and externally rotated Skin: No rashes,no icterus. MSK: Normal muscle bulk,tone, power  Medications reviewed:  Scheduled Meds:  chlorhexidine  60 mL Topical Once   enoxaparin (LOVENOX) injection  40 mg Subcutaneous Q24H  gabapentin  600 mg Oral QHS   insulin aspart  0-15 Units Subcutaneous Q4H   lisinopril  2.5 mg Oral q morning   loratadine  10 mg Oral Daily   mupirocin ointment  1 Application Nasal BID   pantoprazole  40 mg Oral Daily   povidone-iodine  2 Application Topical Once   venlafaxine XR  150 mg Oral QHS   Continuous Infusions:   ceFAZolin (ANCEF) IV     tranexamic acid      Diet Order             Diet NPO time specified  Diet effective ____           Diet NPO time specified Except for: Sips  with Meds  Diet effective midnight           Diet NPO time specified Except for: Sips with Meds  Diet effective now                  Intake/Output Summary (Last 24 hours) at 06/30/2022 1120 Last data filed at 06/30/2022 1000 Gross per 24 hour  Intake 1116.17 ml  Output 0 ml  Net 1116.17 ml   Net IO Since Admission: 1,116.17 mL [06/30/22 1120]  Wt Readings from Last 3 Encounters:  06/29/22 81.6 kg  06/26/22 81.7 kg  03/25/22 83.5 kg     Unresulted Labs (From admission, onward)     Start     Ordered   07/06/22 0500  Creatinine, serum  (enoxaparin (LOVENOX)    CrCl >/= 30 ml/min)  Weekly,   R     Comments: while on enoxaparin therapy    06/29/22 1501   07/01/22 0500  Basic metabolic panel  Daily at 5am,   R      06/30/22 0744   07/01/22 0500  CBC  Daily at 5am,   R      06/30/22 0744   06/30/22 1113  Type and screen Order type and screen if day of surgery is less than 15 days from draw of preadmission visit or order morning of surgery if day of surgery is greater than 6 days from preadmission visit.  Once,   R       Comments: Order type and screen if day of surgery is less than 15 days from draw of preadmission visit or order morning of surgery if day of surgery is greater than 6 days from preadmission visit.    06/30/22 1113   06/30/22 0456  HIV Antibody (routine testing w rflx)  Once,   R        06/30/22 0456          Data Reviewed: I have personally reviewed following labs and imaging studies CBC: Recent Labs  Lab 06/29/22 1335 06/30/22 0456  WBC 7.8 6.1  NEUTROABS 4.8  --   HGB 12.6 12.8  HCT 39.0 40.0  MCV 89.2 91.3  PLT 163 170   Basic Metabolic Panel: Recent Labs  Lab 06/29/22 1335 06/30/22 0456  NA 136 135  K 3.8 4.0  CL 98 98  CO2 25 31  GLUCOSE 165* 187*  BUN 22 18  CREATININE 0.65 0.60  CALCIUM 9.8 9.0  MG  --  1.4*   GFR: Estimated Creatinine Clearance: 78.1 mL/min (by C-G formula based on SCr of 0.6 mg/dL). Liver Function  Tests: Recent Labs  Lab 06/29/22 1335  AST 39  ALT 29  ALKPHOS 88  BILITOT 1.0  PROT 6.9  ALBUMIN 3.7  CBG: Recent Labs  Lab 06/29/22 2004 06/29/22 2357 06/30/22 0400 06/30/22 0746  GLUCAP 172* 175* 153* 149*   Antimicrobials: Anti-infectives (From admission, onward)    Start     Dose/Rate Route Frequency Ordered Stop   06/30/22 1200  ceFAZolin (ANCEF) IVPB 2g/100 mL premix        2 g 200 mL/hr over 30 Minutes Intravenous On call to O.R. 06/30/22 1113 07/01/22 0559      Culture/Microbiology    Component Value Date/Time   SDES URINE, RANDOM 09/21/2011 1552   SPECREQUEST NONE 09/21/2011 1552   CULT INSIGNIFICANT GROWTH 09/21/2011 1552   REPTSTATUS 09/22/2011 FINAL 09/21/2011 1552  Radiology Studies: DG Chest Port 1 View  Result Date: 06/29/2022 CLINICAL DATA:  Fall, hip fracture EXAM: PORTABLE CHEST 1 VIEW COMPARISON:  11/13/2021 FINDINGS: The heart size and mediastinal contours are within normal limits. Both lungs are clear. The visualized skeletal structures are unremarkable. IMPRESSION: No active disease. Electronically Signed   By: Duanne Guess D.O.   On: 06/29/2022 13:30   DG Hip Unilat  With Pelvis 2-3 Views Right  Result Date: 06/29/2022 CLINICAL DATA:  Trauma, fall EXAM: DG HIP (WITH OR WITHOUT PELVIS) 2-3V RIGHT COMPARISON:  None Available. FINDINGS: Subcapital fracture is seen in the neck of right femur. Lays over riding of fracture fragments. There is no dislocation. IMPRESSION: Displaced comminuted fracture is seen in the neck of right femur. Electronically Signed   By: Ernie Avena M.D.   On: 06/29/2022 11:40     LOS: 1 day   Lanae Boast, MD [facility]  06/30/2022, 11:20 AM

## 2022-06-30 NOTE — Anesthesia Preprocedure Evaluation (Addendum)
Anesthesia Evaluation  Patient identified by MRN, date of birth, ID band Patient awake    Reviewed: Allergy & Precautions, NPO status , Patient's Chart, lab work & pertinent test results  History of Anesthesia Complications Negative for: history of anesthetic complications  Airway Mallampati: II  TM Distance: >3 FB Neck ROM: Full    Dental  (+) Dental Advisory Given, Partial Upper, Edentulous Lower   Pulmonary neg pulmonary ROS   Pulmonary exam normal        Cardiovascular negative cardio ROS Normal cardiovascular exam     Neuro/Psych  PSYCHIATRIC DISORDERS Anxiety Depression     Neuromuscular disease    GI/Hepatic Neg liver ROS,GERD  Medicated and Controlled,,  Endo/Other  diabetes, Type 2, Oral Hypoglycemic Agents    Renal/GU negative Renal ROS     Musculoskeletal negative musculoskeletal ROS (+)    Abdominal   Peds  Hematology negative hematology ROS (+)   Anesthesia Other Findings   Reproductive/Obstetrics                             Anesthesia Physical Anesthesia Plan  ASA: 2  Anesthesia Plan: General   Post-op Pain Management: Tylenol PO (pre-op)* and Dilaudid IV   Induction: Intravenous  PONV Risk Score and Plan: 3 and Treatment may vary due to age or medical condition, Ondansetron, Dexamethasone and Midazolam  Airway Management Planned: Oral ETT  Additional Equipment: None  Intra-op Plan:   Post-operative Plan: Extubation in OR  Informed Consent: I have reviewed the patients History and Physical, chart, labs and discussed the procedure including the risks, benefits and alternatives for the proposed anesthesia with the patient or authorized representative who has indicated his/her understanding and acceptance.     Dental advisory given  Plan Discussed with: CRNA and Anesthesiologist  Anesthesia Plan Comments:         Anesthesia Quick Evaluation

## 2022-06-30 NOTE — Hospital Course (Addendum)
66 year old female with T2DM HTN anxiety, GERD seen after a fall after a fall found to have right femur fracture comminuted and displaced, Dr. Chrissie Noa from orthopedic was discussed and transferred to [facility] hospital from [facility] for operative intervention. 11/20: s/p Right THA>.  Significant AKI noted postop, also had soft blood pressure. 11/22: Creatinine improved with IV fluids but hemoglobin drifting

## 2022-06-30 NOTE — Consult Note (Signed)
ORTHOPAEDIC CONSULTATION  REQUESTING PHYSICIAN: Lanae Boast, MD  PCP:  Tommie Sams, DO  Chief Complaint: Fall, right hip pain   HPI: Denise Hartman is a 66 y.o. female who was  brought in by EMS patient had a fall at church in [place].  [place] of the [place].  Larey Seat on her right side complaining of right hip pain.  And deformity to right hip.  No other injuries.  Did not hit head no loss of consciousness.   Imaging in the ER shows right femoral neck fracture. Orthopaedics was consulted for evaluation and management.   Today, on exam, patient is resting in her bed. She tells me she lives at home with a family member who will be able to help her at discharge. She is not on blood thinners. We discussed her A1c, and she tells me she had a recent right shoulder cortisone injection which may have bumped this up.   Past Medical History:  Diagnosis Date   Diabetes mellitus    Diverticulitis    Hypertension    Past Surgical History:  Procedure Laterality Date   ABDOMINAL HYSTERECTOMY     CHOLECYSTECTOMY     KNEE SURGERY     TONSILLECTOMY     Social History   Socioeconomic History   Marital status: Divorced    Spouse name: Not on file   Number of children: Not on file   Years of education: Not on file   Highest education level: Not on file  Occupational History   Not on file  Tobacco Use   Smoking status: Never   Smokeless tobacco: Not on file  Substance and Sexual Activity   Alcohol use: No   Drug use: No   Sexual activity: Yes    Birth control/protection: Surgical  Other Topics Concern   Not on file  Social History Narrative   Not on file   Social Determinants of Health   Financial Resource Strain: Not on file  Food Insecurity: Not on file  Transportation Needs: Not on file  Physical Activity: Not on file  Stress: Not on file  Social Connections: Not on file   Family History  Problem Relation Age of Onset   Heart attack Mother     Heart attack Father    Allergies  Allergen Reactions   Shellfish Allergy Anaphylaxis   Oxycodone Itching   Penicillins Hives and Itching   Sulfa Antibiotics    Prior to Admission medications   Medication Sig Start Date End Date Taking? Authorizing Provider  acetaminophen (TYLENOL) 500 MG tablet Take 500 mg by mouth every 6 (six) hours as needed. For pain   Yes [provider]  ARTIFICIAL TEAR OP Apply 1 drop to eye as needed. For dry eyes   Yes [provider]  cetirizine (ZYRTEC) 10 MG tablet Take by mouth.   Yes [provider]  gabapentin (NEURONTIN) 300 MG capsule TAKE 2 CAPSULES BY MOUTH NIGHTLY 06/26/22  Yes Cook, Jayce G, DO  lisinopril (ZESTRIL) 2.5 MG tablet Take 1 tablet by mouth every morning. 08/14/20  Yes [provider]  metFORMIN (GLUCOPHAGE) 500 MG tablet Take 2 tablets (1,000 mg total) by mouth 2 (two) times daily. 03/05/22  Yes Cook, Jayce G, DO  Omeprazole 20 MG TBEC Take 20 mg by mouth. 08/14/20  Yes [provider]  pioglitazone (ACTOS) 30 MG tablet Take 30 mg by mouth daily. 02/10/22  Yes [provider]  venlafaxine XR (  EFFEXOR-XR) 75 MG 24 hr capsule Take 2 capsules (150 mg total) by mouth at bedtime. 03/03/22  Yes Cook, Jayce G, DO  Continuous Blood Gluc Receiver (FREESTYLE LIBRE 2 READER) DEVI Use as directed to check blood glucose. Patient not taking: Reported on 06/29/2022 06/26/22   Coral Spikes, DO  Continuous Blood Gluc Sensor (FREESTYLE LIBRE 2 SENSOR) MISC Apply as directed to check blood glucose. Change every 14 days. Patient not taking: Reported on 06/29/2022 06/26/22   Coral Spikes, DO  Dulaglutide (TRULICITY) [id number] SOPN Inject 0.75 mg into the skin once a week. Patient not taking: Reported on 06/29/2022 03/25/22   Coral Spikes, DO   DG Chest Port 1 View  Result Date: 06/29/2022 CLINICAL DATA:  Fall, hip fracture EXAM: PORTABLE CHEST 1 VIEW COMPARISON:  11/13/2021 FINDINGS: The heart size and  mediastinal contours are within normal limits. Both lungs are clear. The visualized skeletal structures are unremarkable. IMPRESSION: No active disease. Electronically Signed   By: Davina Poke D.O.   On: 06/29/2022 13:30   DG Hip Unilat  With Pelvis 2-3 Views Right  Result Date: 06/29/2022 CLINICAL DATA:  Trauma, fall EXAM: DG HIP (WITH OR WITHOUT PELVIS) 2-3V RIGHT COMPARISON:  None Available. FINDINGS: Subcapital fracture is seen in the neck of right femur. Lays over riding of fracture fragments. There is no dislocation. IMPRESSION: Displaced comminuted fracture is seen in the neck of right femur. Electronically Signed   By: Elmer Picker M.D.   On: 06/29/2022 11:40    Positive ROS: All other systems have been reviewed and were otherwise negative with the exception of those mentioned in the HPI and as above.  Physical Exam: General: Alert, no acute distress Cardiovascular: No pedal edema Respiratory: No cyanosis, no use of accessory musculature Skin: No lesions in the area of chief complaint Neurologic: Sensation intact distally Psychiatric: Patient is competent for consent with normal mood and affect   MUSCULOSKELETAL:  RLE:  Right LE shortened and externally rotated. She is able to dorsiflex her foot and move her toes freely. Sensation intact distally, pulses intact. ROM of the hip deferred.   Assessment: Right femoral neck fracture  Plan:  I discussed with Denise Hartman her diagnosis of right femoral neck fracture as well as treatment plan for surgical intervention with right total hip arthroplasty. We discussed her A1c as a risk factor for infection, but in an emergency setting we will proceed and likely use antibiotics post-op as well as careful blood sugar control. She has family help at home, so would anticipate discharge home later this week.   Plan for surgery this afternoon. Remain NPO. NWB for now.    Irving Copas, PA-C Cell [phone]   06/30/2022 7:39 AM

## 2022-06-30 NOTE — Anesthesia Procedure Notes (Signed)
Procedure Name: Intubation Date/Time: 06/30/2022 3:56 PM  Performed by: Montel Clock, CRNAPre-anesthesia Checklist: Patient identified, Emergency Drugs available, Suction available, Patient being monitored and Timeout performed Patient Re-evaluated:Patient Re-evaluated prior to induction Oxygen Delivery Method: Circle system utilized Preoxygenation: Pre-oxygenation with 100% oxygen Induction Type: IV induction Ventilation: Mask ventilation without difficulty Laryngoscope Size: Mac and 3 Grade View: Grade I Tube type: Oral Tube size: 7.0 mm Number of attempts: 1 Airway Equipment and Method: Stylet Placement Confirmation: ETT inserted through vocal cords under direct vision, positive ETCO2 and breath sounds checked- equal and bilateral Secured at: 21 cm Tube secured with: Tape Dental Injury: Teeth and Oropharynx as per pre-operative assessment

## 2022-06-30 NOTE — Op Note (Signed)
NAME:  Denise Hartman                ACCOUNT NO.: [id number]      MEDICAL RECORD NO.: [id number]      FACILITY:  [facility]      PHYSICIAN:  [name]  DATE OF BIRTH:  [birth year]     DATE OF PROCEDURE:  06/30/2022                                 OPERATIVE REPORT         PREOPERATIVE DIAGNOSIS: Right hip displaced femoral neck fracture     POSTOPERATIVE DIAGNOSIS:  Right hip displaced femoral neck fracture      PROCEDURE:  Right total hip replacement through an anterior approach   utilizing DePuy THR system, component size 50 mm pinnacle cup, a size 32+4 neutral   Altrex liner, a size 4 HI Actis stem with a 32+1 delta ceramic   ball.      SURGEON:  Madlyn Frankel. Charlann Boxer, M.D.      ASSISTANT:  Rosalene Billings, PA-C     ANESTHESIA:  General.      SPECIMENS:  None.      COMPLICATIONS:  None.      BLOOD LOSS:  500 cc     DRAINS:  None.      INDICATION OF THE PROCEDURE:  Denise Hartman is a 66 y.o. female presented to the emergency room at [facility] after falling at church.  She got her feet tangled up in a church pew and landed on her right hip.  She had immediate onset of pain and inability to bear weight.  She was transferred to the emergency room where radiographs revealed a displaced right femoral neck fracture.  As we are covering the [facility] we were contacted for definitive management.  She was subsequently transferred to [facility] for management.  Based on her age and the displaced nature of her femoral neck fracture I felt that it was in her best interest to have a total hip replacement as treatment.  The risks of infection DVT component failure dislocation neurovascular injury were reviewed and discussed.  She unfortunately has an elevated A1c level indicating poor glucose control.  She would be at higher risk of infection this point.  We will likely put her on prophylactic antibiotics postoperatively.  We will try to work  with our hospital staff and team to try to enhance glucose control perioperatively.  Consent was obtained for benefit of pain relief as well as management of her fracture.     PROCEDURE IN DETAIL:  The patient was brought to operative theater.   Once adequate anesthesia, preoperative antibiotics, 2 gm of Ancef, 1 gm of Tranexamic Acid, and 10 mg of Decadron were administered, the patient was positioned supine on the [employer] table.  Once the patient was safely positioned with adequate padding of boney prominences we predraped out the hip, and used fluoroscopy to confirm orientation of the pelvis.      The right hip was then prepped and draped from proximal iliac crest to   mid thigh with a shower curtain technique.      Time-out was performed identifying the patient, planned procedure, and the appropriate extremity.     An incision was then made 2 cm lateral to the   anterior superior iliac spine extending  over the orientation of the   tensor fascia lata muscle and sharp dissection was carried down to the   fascia of the muscle.      The fascia was then incised.  The muscle belly was identified and swept   laterally and retractor placed along the superior neck.  Following   cauterization of the circumflex vessels and removing some pericapsular   fat, a second cobra retractor was placed on the inferior neck.  A T-capsulotomy was made along the line of the   superior neck to the trochanteric fossa, then extended proximally and   distally.  Tag sutures were placed and the retractors were then placed   intracapsular.  We then identified the trochanteric fossa and   orientation of my neck cut and then made a neck osteotomy with the femur on traction.  The fractured femoral neck segment was removed followed by removal of the femoral without difficulty or complication.  Traction was let   off and retractors were placed posterior and anterior around the   acetabulum.      The labrum and foveal  tissue were debrided.  I began reaming with a 47 mm   reamer and reamed up to 49 mm reamer with good bony bed preparation and a 50 mm  cup was chosen.  The final 50 mm Pinnacle cup was then impacted under fluoroscopy to confirm the depth of penetration and orientation with respect to   Abduction and forward flexion.  A screw was placed into the ilium followed by the hole eliminator.  The final   32+4 neutral Altrex liner was impacted with good visualized rim fit.  The cup was positioned anatomically within the acetabular portion of the pelvis.      At this point, the femur was rolled to 100 degrees.  Further capsule was   released off the inferior aspect of the femoral neck.  I then   released the superior capsule proximally.  With the leg in a neutral position the hook was placed laterally   along the femur under the vastus lateralis origin and elevated manually and then held in position using the hook attachment on the bed.  The leg was then extended and adducted with the leg rolled to 100   degrees of external rotation.  Retractors were placed along the medial calcar and posteriorly over the greater trochanter.  Once the proximal femur was fully   exposed, I used a box osteotome to set orientation.  I then began   broaching with the starting chili pepper broach and passed this by hand and then broached up to 4.  With the 4 broach in place I chose a high offset neck and did several trial reductions.  The offset was appropriate, leg lengths   appeared to be equal best matched with the +1 head ball trial confirmed radiographically.   Given these findings, I went ahead and dislocated the hip, repositioned all   retractors and positioned the right hip in the extended and abducted position.  The final 4 Hi Actis stem was   chosen and it was impacted down to the level of neck cut.  Based on this   and the trial reductions, a final 32+1 delta ceramic ball was chosen and   impacted onto a clean and dry  trunnion, and the hip was reduced.  The   hip had been irrigated throughout the case again at this point.  I did   reapproximate the superior capsular leaflet  to the anterior leaflet   using #1 Vicryl.  The fascia of the   tensor fascia lata muscle was then reapproximated using #1 Vicryl and #0 Stratafix sutures.  The   remaining wound was closed with 2-0 Vicryl and running 4-0 Monocryl.   The hip was cleaned, dried, and dressed sterilely using Dermabond and   Aquacel dressing.  The patient was then brought   to recovery room in stable condition tolerating the procedure well.    Costella Hatcher, PA-C was present for the entirety of the case involved from   preoperative positioning, perioperative retractor management, general   facilitation of the case, as well as primary wound closure as assistant.            Pietro Cassis Alvan Dame, M.D.        06/30/2022 3:49 PM

## 2022-06-30 NOTE — Interval H&P Note (Signed)
History and Physical Interval Note:  06/30/2022 3:36 PM  Denise Hartman  has presented today for surgery, with the diagnosis of RIGHT FEMORAL NECK FRACTURE.  The various methods of treatment have been discussed with the patient and family. After consideration of risks, benefits and other options for treatment, the patient has consented to  Procedure(s): RIGHT TOTAL HIP ARTHROPLASTY ANTERIOR APPROACH (Right) as a surgical intervention.  The patient's history has been reviewed, patient examined, no change in status, stable for surgery.  I have reviewed the patient's chart and labs.  Questions were answered to the patient's satisfaction.     [name]

## 2022-06-30 NOTE — Discharge Instructions (Signed)

## 2022-06-30 NOTE — TOC Initial Note (Signed)
Transition of Care [facility]) - Initial/Assessment Note    Patient Details  Name: Denise Hartman MRN: [id number] Date of Birth: 01-08-1956  Transition of Care [facility]) CM/SW Contact:    Lennart Pall, LCSW Phone Number: 06/30/2022, 1:58 PM  Clinical Narrative:                 Met with pt today who is awaiting surgery this afternoon.  Reviewed possible dc planning needs.  Pt reports that she lives with cousin and cousin's spouse and notes cousin can provide assistance if needed.  She denies any concerns about dc home.  She does NOT have any DME at home.  Aware TOC will follow up after surgery and therapy evals to make any needed referrals.  Expected Discharge Plan: [place] Barriers to Discharge: Continued Medical Work up   Patient Goals and CMS Choice Patient states their goals for this hospitalization and ongoing recovery are:: return home CMS Medicare.gov Compare Post Acute Care list provided to:: Patient Choice offered to / list presented to : Patient  Expected Discharge Plan and Services Expected Discharge Plan: [place] In-house Referral: Clinical Social Work   Post Acute Care Choice: Home Health, Durable Medical Equipment Living arrangements for the past 2 months: [place]                                      Prior Living Arrangements/Services Living arrangements for the past 2 months: Single Family Home Lives with:: Relatives Patient language and need for interpreter reviewed:: Yes Do you feel safe going back to the place where you live?: Yes      Need for Family Participation in Patient Care: Yes (Comment) Care giver support system in place?: Yes (comment)   Criminal Activity/Legal Involvement Pertinent to Current Situation/Hospitalization: No - Comment as needed  Activities of Daily Living Home Assistive Devices/Equipment: None ADL Screening (condition at time of admission) Patient's cognitive ability adequate to  safely complete daily activities?: Yes Is the patient deaf or have difficulty hearing?: No Does the patient have difficulty seeing, even when wearing glasses/contacts?: No Does the patient have difficulty concentrating, remembering, or making decisions?: No Patient able to express need for assistance with ADLs?: Yes Does the patient have difficulty dressing or bathing?: No Independently performs ADLs?: Yes (appropriate for developmental age) Does the patient have difficulty walking or climbing stairs?: No Weakness of Legs: Right Weakness of Arms/Hands: None  Permission Sought/Granted Permission sought to share information with : Family Supports Permission granted to share information with : Yes, Verbal Permission Granted  Share Information with NAME: Luberta Grabinski     Permission granted to share info w Relationship: brother  Permission granted to share info w Contact Information: [phone]  Emotional Assessment Appearance:: Appears stated age Attitude/Demeanor/Rapport: Gracious Affect (typically observed): Pleasant Orientation: : Oriented to Self, Oriented to Place, Oriented to  Time, Oriented to Situation Alcohol / Substance Use: Not Applicable Psych Involvement: No (comment)  Admission diagnosis:  Hip fracture ([place]) [S72.009A] Closed fracture of right hip, initial encounter ([place]) [S72.001A] Patient Active Problem List   Diagnosis Date Noted   Hip fracture ([place]) 06/29/2022   Fatigue 06/27/2022   Right shoulder pain 06/27/2022   Hypertension 12/26/2020   Gastroesophageal reflux disease without esophagitis 11/19/2020   Mixed hyperlipidemia 02/09/2020   Overweight with body mass index (BMI) of 28 to 28.9 in adult 04/26/2019  Depression with anxiety 03/24/2018   Type 2 diabetes mellitus with diabetic polyneuropathy, without long-term current use of insulin ([place]) 03/24/2018   PCP:  Coral Spikes, DO Pharmacy:   [facility] [address] [place] [address] Phone: [phone] Fax: [phone]     Social Determinants of Health (SDOH) Interventions Food Insecurity Interventions: Patient Refused Housing Interventions: Patient Refused Transportation Interventions: Patient Refused Utilities Interventions: Patient Refused  Readmission Risk Interventions    06/30/2022    1:56 PM  Readmission Risk Prevention Plan  Post Dischage Appt Complete  Medication Screening Complete  Transportation Screening Complete

## 2022-06-30 NOTE — H&P (View-Only) (Signed)
ORTHOPAEDIC CONSULTATION  REQUESTING PHYSICIAN: Denise Boast, MD  PCP:  Denise Sams, DO  Chief Complaint: Fall, right hip pain   HPI: Denise Hartman is a 66 y.o. female who was  brought in by EMS patient had a fall at church in [place].  [place] stumbled [place] one of the steps.  Larey Seat on her right side complaining of right hip pain.  And deformity to right hip.  No other injuries.  Did not hit head no loss of consciousness.   Imaging in the ER shows right femoral neck fracture. Orthopaedics was consulted for evaluation and management.   Today, on exam, patient is resting in her bed. She tells me she lives at home with a family member who will be able to help her at discharge. She is not on blood thinners. We discussed her A1c, and she tells me she had a recent right shoulder cortisone injection which may have bumped this up.   Past Medical History:  Diagnosis Date   Diabetes mellitus    Diverticulitis    Hypertension    Past Surgical History:  Procedure Laterality Date   ABDOMINAL HYSTERECTOMY     CHOLECYSTECTOMY     KNEE SURGERY     TONSILLECTOMY     Social History   Socioeconomic History   Marital status: Divorced    Spouse name: Not on file   Number of children: Not on file   Years of education: Not on file   Highest education level: Not on file  Occupational History   Not on file  Tobacco Use   Smoking status: Never   Smokeless tobacco: Not on file  Substance and Sexual Activity   Alcohol use: No   Drug use: No   Sexual activity: Yes    Birth control/protection: Surgical  Other Topics Concern   Not on file  Social History Narrative   Not on file   Social Determinants of Health   Financial Resource Strain: Not on file  Food Insecurity: Not on file  Transportation Needs: Not on file  Physical Activity: Not on file  Stress: Not on file  Social Connections: Not on file   Family History  Problem Relation Age of Onset   Heart attack Mother     Heart attack Father    Allergies  Allergen Reactions   Shellfish Allergy Anaphylaxis   Oxycodone Itching   Penicillins Hives and Itching   Sulfa Antibiotics    Prior to Admission medications   Medication Sig Start Date End Date Taking? Authorizing Provider  acetaminophen (TYLENOL) 500 MG tablet Take 500 mg by mouth every 6 (six) hours as needed. For pain   Yes [provider]  ARTIFICIAL TEAR OP Apply 1 drop to eye as needed. For dry eyes   Yes [provider]  cetirizine (ZYRTEC) 10 MG tablet Take by mouth.   Yes [provider]  gabapentin (NEURONTIN) 300 MG capsule TAKE 2 CAPSULES BY MOUTH NIGHTLY 06/26/22  Yes Cook, Jayce G, DO  lisinopril (ZESTRIL) 2.5 MG tablet Take 1 tablet by mouth every morning. 08/14/20  Yes [provider]  metFORMIN (GLUCOPHAGE) 500 MG tablet Take 2 tablets (1,000 mg total) by mouth 2 (two) times daily. 03/05/22  Yes Cook, Jayce G, DO  Omeprazole 20 MG TBEC Take 20 mg by mouth. 08/14/20  Yes [provider]  pioglitazone (ACTOS) 30 MG tablet Take 30 mg by mouth daily. 02/10/22  Yes [provider]  venlafaxine XR (  EFFEXOR-XR) 75 MG 24 hr capsule Take 2 capsules (150 mg total) by mouth at bedtime. 03/03/22  Yes Cook, Jayce G, DO  Continuous Blood Gluc Receiver (FREESTYLE LIBRE 2 READER) DEVI Use as directed to check blood glucose. Patient not taking: Reported on 06/29/2022 06/26/22   Cook, Jayce G, DO  Continuous Blood Gluc Sensor (FREESTYLE LIBRE 2 SENSOR) MISC Apply as directed to check blood glucose. Change every 14 days. Patient not taking: Reported on 06/29/2022 06/26/22   Cook, Jayce G, DO  Dulaglutide (TRULICITY) 0.75 MG/0.5ML SOPN Inject 0.75 mg into the skin once a week. Patient not taking: Reported on 06/29/2022 03/25/22   Cook, Jayce G, DO   DG Chest Port 1 View  Result Date: 06/29/2022 CLINICAL DATA:  Fall, hip fracture EXAM: PORTABLE CHEST 1 VIEW COMPARISON:  11/13/2021 FINDINGS: The heart size and  mediastinal contours are within normal limits. Both lungs are clear. The visualized skeletal structures are unremarkable. IMPRESSION: No active disease. Electronically Signed   By: Nicholas  Plundo D.O.   On: 06/29/2022 13:30   DG Hip Unilat  With Pelvis 2-3 Views Right  Result Date: 06/29/2022 CLINICAL DATA:  Trauma, fall EXAM: DG HIP (WITH OR WITHOUT PELVIS) 2-3V RIGHT COMPARISON:  None Available. FINDINGS: Subcapital fracture is seen in the neck of right femur. Lays over riding of fracture fragments. There is no dislocation. IMPRESSION: Displaced comminuted fracture is seen in the neck of right femur. Electronically Signed   By: Palani  Rathinasamy M.D.   On: 06/29/2022 11:40    Positive ROS: All other systems have been reviewed and were otherwise negative with the exception of those mentioned in the HPI and as above.  Physical Exam: General: Alert, no acute distress Cardiovascular: No pedal edema Respiratory: No cyanosis, no use of accessory musculature Skin: No lesions in the area of chief complaint Neurologic: Sensation intact distally Psychiatric: Patient is competent for consent with normal mood and affect   MUSCULOSKELETAL:  RLE:  Right LE shortened and externally rotated. She is able to dorsiflex her foot and move her toes freely. Sensation intact distally, pulses intact. ROM of the hip deferred.   Assessment: Right femoral neck fracture  Plan:  I discussed with [name] her diagnosis of right femoral neck fracture as well as treatment plan for surgical intervention with right total hip arthroplasty. We discussed her A1c as a risk factor for infection, but in an emergency setting we will proceed and likely use antibiotics post-op as well as careful blood sugar control. She has family help at home, so would anticipate discharge home later this week.   Plan for surgery this afternoon. Remain NPO. NWB for now.    Kyshawn Teal R Lakeyta Vandenheuvel, PA-C Cell [phone]  [phone]   06/30/2022 7:39 AM  

## 2022-06-30 NOTE — Transfer of Care (Signed)
Immediate Anesthesia Transfer of Care Note  Patient: Denise Hartman  Procedure(s) Performed: RIGHT TOTAL HIP ARTHROPLASTY ANTERIOR APPROACH (Right: Hip)  Patient Location: PACU  Anesthesia Type:General  Level of Consciousness: awake, alert , oriented, and patient cooperative  Airway & Oxygen Therapy: Patient Spontanous Breathing and Patient connected to face mask oxygen  Post-op Assessment: Report given to RN and Post -op Vital signs reviewed and stable  Post vital signs: Reviewed and stable  Last Vitals:  Vitals Value Taken Time  BP 115/52 06/30/22 1730  Temp 37.7 C 06/30/22 1728  Pulse 80 06/30/22 1731  Resp 13 06/30/22 1731  SpO2 96 % 06/30/22 1731  Vitals shown include unvalidated device data.  Last Pain:  Vitals:   06/30/22 1728  TempSrc:   PainSc: Asleep      Patients Stated Pain Goal: 2 (06/30/22 0615)  Complications: No notable events documented.

## 2022-06-30 NOTE — Inpatient Diabetes Management (Signed)
Inpatient Diabetes Program Recommendations  AACE/ADA: New Consensus Statement on Inpatient Glycemic Control (2015)  Target Ranges:  Prepandial:   less than 140 mg/dL      Peak postprandial:   less than 180 mg/dL (1-2 hours)      Critically ill patients:  140 - 180 mg/dL   Lab Results  Component Value Date   GLUCAP 149 (H) 06/30/2022   HGBA1C 9.6 (H) 06/26/2022    Review of Glycemic Control  Latest Reference Range & Units 06/29/22 23:57 06/30/22 04:00 06/30/22 07:46  Glucose-Capillary 70 - 99 mg/dL 400 (H) 867 (H) 619 (H)  (H): Data is abnormally high Diabetes history: type 2 DM Outpatient Diabetes medications: Actos 30 mg QD, Metformin 1000 mg BID, Trulicity 0.75 mg Qwk (NT) Current orders for Inpatient glycemic control: Novolog 0-15 units Q4H  Inpatient Diabetes Program Recommendations:    Consider adding Semglee 12 units QD.   Thanks, Lujean Rave, MSN, RNC-OB Diabetes Coordinator [phone] (8a-5p)

## 2022-06-30 NOTE — Anesthesia Postprocedure Evaluation (Signed)
Anesthesia Post Note  Patient: Denise Hartman  Procedure(s) Performed: RIGHT TOTAL HIP ARTHROPLASTY ANTERIOR APPROACH (Right: Hip)     Patient location during evaluation: PACU Anesthesia Type: General Level of consciousness: awake and alert Pain management: pain level controlled Vital Signs Assessment: post-procedure vital signs reviewed and stable Respiratory status: spontaneous breathing, nonlabored ventilation and respiratory function stable Cardiovascular status: stable and blood pressure returned to baseline Anesthetic complications: no   No notable events documented.  Last Vitals:  Vitals:   06/30/22 1940 06/30/22 2031  BP: (!) 103/57 (!) 118/49  Pulse: 83 80  Resp: 18 18  Temp: 36.8 C 36.8 C  SpO2: 99% 99%    Last Pain:  Vitals:   06/30/22 2031  TempSrc: Oral  PainSc:                  Beryle Lathe

## 2022-07-01 ENCOUNTER — Encounter (HOSPITAL_COMMUNITY): Payer: Self-pay | Admitting: Orthopedic Surgery

## 2022-07-01 DIAGNOSIS — S72001A Fracture of unspecified part of neck of right femur, initial encounter for closed fracture: Secondary | ICD-10-CM | POA: Diagnosis not present

## 2022-07-01 LAB — GLUCOSE, CAPILLARY
Glucose-Capillary: 165 mg/dL — ABNORMAL HIGH (ref 70–99)
Glucose-Capillary: 196 mg/dL — ABNORMAL HIGH (ref 70–99)
Glucose-Capillary: 213 mg/dL — ABNORMAL HIGH (ref 70–99)
Glucose-Capillary: 236 mg/dL — ABNORMAL HIGH (ref 70–99)
Glucose-Capillary: 253 mg/dL — ABNORMAL HIGH (ref 70–99)
Glucose-Capillary: 343 mg/dL — ABNORMAL HIGH (ref 70–99)
Glucose-Capillary: 352 mg/dL — ABNORMAL HIGH (ref 70–99)

## 2022-07-01 LAB — CBC
HCT: 28.8 % — ABNORMAL LOW (ref 36.0–46.0)
Hemoglobin: 9.2 g/dL — ABNORMAL LOW (ref 12.0–15.0)
MCH: 29.3 pg (ref 26.0–34.0)
MCHC: 31.9 g/dL (ref 30.0–36.0)
MCV: 91.7 fL (ref 80.0–100.0)
Platelets: 157 10*3/uL (ref 150–400)
RBC: 3.14 MIL/uL — ABNORMAL LOW (ref 3.87–5.11)
RDW: 13.6 % (ref 11.5–15.5)
WBC: 12.5 10*3/uL — ABNORMAL HIGH (ref 4.0–10.5)
nRBC: 0 % (ref 0.0–0.2)

## 2022-07-01 LAB — BASIC METABOLIC PANEL
Anion gap: 7 (ref 5–15)
BUN: 31 mg/dL — ABNORMAL HIGH (ref 8–23)
CO2: 28 mmol/L (ref 22–32)
Calcium: 7.9 mg/dL — ABNORMAL LOW (ref 8.9–10.3)
Chloride: 98 mmol/L (ref 98–111)
Creatinine, Ser: 1.57 mg/dL — ABNORMAL HIGH (ref 0.44–1.00)
GFR, Estimated: 37 mL/min — ABNORMAL LOW (ref >60)
Glucose, Bld: 207 mg/dL — ABNORMAL HIGH (ref 70–99)
Potassium: 4.5 mmol/L (ref 3.5–5.1)
Sodium: 133 mmol/L — ABNORMAL LOW (ref 135–145)

## 2022-07-01 MED ORDER — SODIUM CHLORIDE 0.9 % IV BOLUS
250.0000 mL | Freq: Once | INTRAVENOUS | Status: AC
  Administered 2022-07-01: 250 mL via INTRAVENOUS

## 2022-07-01 MED ORDER — CEFADROXIL 500 MG PO CAPS
500.0000 mg | ORAL_CAPSULE | Freq: Two times a day (BID) | ORAL | Status: DC
  Administered 2022-07-01 – 2022-07-03 (×5): 500 mg via ORAL
  Filled 2022-07-01 (×6): qty 1

## 2022-07-01 MED ORDER — INSULIN ASPART 100 UNIT/ML IJ SOLN
0.0000 [IU] | Freq: Three times a day (TID) | INTRAMUSCULAR | Status: DC
  Administered 2022-07-01: 11 [IU] via SUBCUTANEOUS
  Administered 2022-07-02: 3 [IU] via SUBCUTANEOUS
  Administered 2022-07-02: 11 [IU] via SUBCUTANEOUS
  Administered 2022-07-02 – 2022-07-03 (×3): 3 [IU] via SUBCUTANEOUS

## 2022-07-01 MED ORDER — INSULIN GLARGINE-YFGN 100 UNIT/ML ~~LOC~~ SOPN
12.0000 [IU] | PEN_INJECTOR | Freq: Every day | SUBCUTANEOUS | Status: DC
  Filled 2022-07-01: qty 3

## 2022-07-01 MED ORDER — SODIUM CHLORIDE 0.9 % IV BOLUS
1000.0000 mL | Freq: Once | INTRAVENOUS | Status: AC
  Administered 2022-07-01: 1000 mL via INTRAVENOUS

## 2022-07-01 MED ORDER — INSULIN GLARGINE-YFGN 100 UNIT/ML ~~LOC~~ SOLN
12.0000 [IU] | Freq: Every day | SUBCUTANEOUS | Status: DC
  Administered 2022-07-01 – 2022-07-03 (×3): 12 [IU] via SUBCUTANEOUS
  Filled 2022-07-01 (×3): qty 0.12

## 2022-07-01 NOTE — Progress Notes (Signed)
PROGRESS NOTE Denise GermanyDonna K Hartman  ZOX:[id number] DOB: 09/20/1955 DOA: 06/29/2022 PCP: Tommie Samsook, Jayce G, DO   Brief Narrative/Hospital Course: 66 year old female with T2DM HTN anxiety, GERD seen after a fall after a fall found to have right femur fracture comminuted and displaced, Dr. Chrissie NoaWilliam from orthopedic was discussed and [facility] operative intervention. 11/20: s/p Right THA  Subjective: Seen and examined this morning, alert awake no complaint.  Pain is controlled on the right hip. Overnight afebrile Tmax 99.8 blood pressure has been soft 90s/40s. Lab this morning showed AKI> despite being on IV fluids Also with leukocytosis and hemoglobin 9.2 UOP 2000cc> Foley has been discontinued  Assessment and Plan: Principal Problem:   Hip fracture (HCC) Active Problems:   Depression with anxiety   Gastroesophageal reflux disease without esophagitis   Hypertension   Mixed hyperlipidemia   Overweight with body mass index (BMI) of 28 to 28.9 in adult   Type 2 diabetes mellitus with diabetic polyneuropathy, without long-term current use of insulin (HCC)   S/P total right hip arthroplasty   Right femoral neck fracture secondary to fall: 11/20: s/p Right THA.  Continue DVT prophylaxis, pain control PT OT as per orthopedics.  AKI creatinine is trending up 1.5 from 0.6 likely multifactorial with soft blood pressure overnight, perio.  We will give bolus IV fluids, continue on increased 125 cc NS, monitor uop.  Continue holding low-dose lisinopril 2.5 mg Was having good urine output on Foley, Foley has been discontinued postop.  Measure strict intake output discussed with nursing staff.   Recent Labs    11/13/21 2113 03/03/22 1035 06/29/22 1335 06/30/22 0456 07/01/22 0434  BUN 17 15 22 18  31*  CREATININE 0.74 0.68 0.65 0.60 1.57*    Hypotension/soft blood pressure overnight home meds remains on hold.  Give bolus IV fluids and continue NS.  Taking p.o. blood pressure  repeated after 1 L bolus and was 105/48.    Type II diabetes mellitus: Blood sugar poorly controlled continue to hold OHA and keep on sliding scale insulin.  Recent Labs  Lab 06/26/22 1354 06/29/22 2004 06/30/22 1730 06/30/22 1952 07/01/22 0024 07/01/22 0355 07/01/22 0748  GLUCAP  --    < > 175* 338* 236* 196* 165*  HGBA1C 9.6*  --   --   --   --   --   --    < > = values in this interval not displayed.    Hypertension: BP  soft- hold home lisinopril 2.5 mg GERD: Continue PPI. Anxiety:Continue her Neurontin, Effexor  DVT prophylaxis: SCDs Start: 06/30/22 1834 Place TED hose Start: 06/30/22 1834.  Asked RN to hold Lovenox Code Status:   Code Status: Full Code Family Communication: plan of care discussed with patient at bedside. Patient status is: Inpatient because of a fracture  Level of care: Med-Surg  Dispo: The patient is from: home            Anticipated disposition: snf vs home Mobility Assessment (last 72 hours)     Mobility Assessment     Row Name 07/01/22 1059 06/30/22 1955 06/30/22 1843 06/30/22 0809 06/29/22 1800   Does patient have an order for bedrest or is patient medically unstable -- No - Continue assessment No - Continue assessment Yes- Bedfast (Level 1) - Complete Yes- Bedfast (Level 1) - Complete   What is the highest level of mobility based on the progressive mobility assessment? Level 5 (Walks with assist in room/hall) - Balance while stepping forward/back  and can walk in room with assist - Complete Level 4 (Walks with assist in room) - Balance while marching in place and cannot step forward and back - Complete Level 4 (Walks with assist in room) - Balance while marching in place and cannot step forward and back - Complete -- --    Row Name 06/29/22 17:49:23           Does patient have an order for bedrest or is patient medically unstable Yes- Bedfast (Level 1) - Complete                 Objective: Vitals last 24 hrs: Vitals:   07/01/22 0209  07/01/22 0212 07/01/22 0604 07/01/22 0920  BP: (!) 91/45 (!) 90/47 (!) 91/43 (!) 105/48  Pulse: 70  77 75  Resp: 18 18 18 18   Temp: 98.3 F (36.8 C)  98 F (36.7 C) 97.9 F (36.6 C)  TempSrc: Oral  Oral Oral  SpO2: 98% 98% 96% 99%  Weight:      Height:       Weight change: -0.648 kg  Physical Examination: General exam: AAox3, weak,older appearing HEENT:Oral mucosa moist, Ear/Nose WNL grossly, dentition normal. Respiratory system: bilaterally clear BS, no use of accessory muscle Cardiovascular system: S1 & S2 +, regular rate, JVD neg. Gastrointestinal system: Abdomen soft, NT,ND,BS+ Nervous System:Alert, awake, moving extremities and grossly nonfocal Extremities: aquacel dressing + on rt femoral  incision site,LE ankle edema neg, lower extremities warm Skin: No rashes,no icterus. MSK: Normal muscle bulk,tone, power   Medications reviewed:  Scheduled Meds:  aspirin  81 mg Oral BID   cefadroxil  500 mg Oral BID   docusate sodium  100 mg Oral BID   gabapentin  600 mg Oral QHS   insulin aspart  0-15 Units Subcutaneous Q4H   loratadine  10 mg Oral Daily   mupirocin ointment  1 Application Nasal BID   pantoprazole  40 mg Oral Daily   polyethylene glycol  17 g Oral BID   venlafaxine XR  150 mg Oral QHS   Continuous Infusions:  sodium chloride 125 mL/hr at 07/01/22 0715   methocarbamol (ROBAXIN) IV      Diet Order             Diet regular Room service appropriate? Yes; Fluid consistency: Thin  Diet effective now                  Intake/Output Summary (Last 24 hours) at 07/01/2022 1103 Last data filed at 07/01/2022 1000 Gross per 24 hour  Intake 3530.11 ml  Output 2500 ml  Net 1030.11 ml   Net IO Since Admission: 2,146.28 mL [07/01/22 1103]  Wt Readings from Last 3 Encounters:  06/30/22 81 kg  06/26/22 81.7 kg  03/25/22 83.5 kg     Unresulted Labs (From admission, onward)     Start     Ordered   07/06/22 0500  Creatinine, serum  (enoxaparin (LOVENOX)     CrCl >/= 30 ml/min)  Weekly,   R     Comments: while on enoxaparin therapy    06/29/22 1501   07/01/22 0500  Basic metabolic panel  Daily at 5am,   R      06/30/22 0744   07/01/22 0500  CBC  Daily at 5am,   R      06/30/22 0744   07/01/22 0500  CBC  Daily at 5am,   R      06/30/22 1833  Data Reviewed: I have personally reviewed following labs and imaging studies CBC: Recent Labs  Lab 06/29/22 1335 06/30/22 0456 07/01/22 0434  WBC 7.8 6.1 12.5*  NEUTROABS 4.8  --   --   HGB 12.6 12.8 9.2*  HCT 39.0 40.0 28.8*  MCV 89.2 91.3 91.7  PLT 163 170 157   Basic Metabolic Panel: Recent Labs  Lab 06/29/22 1335 06/30/22 0456 07/01/22 0434  NA 136 135 133*  K 3.8 4.0 4.5  CL 98 98 98  CO2 25 31 28   GLUCOSE 165* 187* 207*  BUN 22 18 31*  CREATININE 0.65 0.60 1.57*  CALCIUM 9.8 9.0 7.9*  MG  --  1.4*  --    GFR: Estimated Creatinine Clearance: 39.7 mL/min (A) (by C-G formula based on SCr of 1.57 mg/dL (H)). Liver Function Tests: Recent Labs  Lab 06/29/22 1335  AST 39  ALT 29  ALKPHOS 88  BILITOT 1.0  PROT 6.9  ALBUMIN 3.7  CBG: Recent Labs  Lab 06/30/22 1730 06/30/22 1952 07/01/22 0024 07/01/22 0355 07/01/22 0748  GLUCAP 175* 338* 236* 196* 165*   Antimicrobials: Anti-infectives (From admission, onward)    Start     Dose/Rate Route Frequency Ordered Stop   07/01/22 1000  cefadroxil (DURICEF) capsule 500 mg        500 mg Oral 2 times daily 07/01/22 0718 07/08/22 0959   06/30/22 2200  ceFAZolin (ANCEF) IVPB 2g/100 mL premix        2 g 200 mL/hr over 30 Minutes Intravenous Every 6 hours 06/30/22 1833 07/01/22 0433   06/30/22 1200  ceFAZolin (ANCEF) IVPB 2g/100 mL premix        2 g 200 mL/hr over 30 Minutes Intravenous On call to O.R. 06/30/22 1113 06/30/22 1612      Culture/Microbiology    Component Value Date/Time   SDES URINE, RANDOM 09/21/2011 1552   SPECREQUEST NONE 09/21/2011 1552   CULT INSIGNIFICANT GROWTH 09/21/2011 1552    REPTSTATUS 09/22/2011 FINAL 09/21/2011 1552  Radiology Studies: DG Pelvis Portable  Result Date: 06/30/2022 CLINICAL DATA:  Right hip arthroplasty. EXAM: PORTABLE PELVIS 1-2 VIEWS COMPARISON:  None Available. FINDINGS: Right hip arthroplasty in expected alignment. No periprosthetic lucency or fracture. Recent postsurgical change includes air and edema in the soft tissues. IMPRESSION: Right hip arthroplasty without immediate postoperative complication. Electronically Signed   By: 07/02/2022 M.D.   On: 06/30/2022 18:39   DG HIP UNILAT WITH PELVIS 1V RIGHT  Result Date: 06/30/2022 CLINICAL DATA:  Right hip surgery. EXAM: DG HIP (WITH OR WITHOUT PELVIS) 1V RIGHT COMPARISON:  Preoperative radiographs FINDINGS: Three fluoroscopic spot views of the pelvis and right hip obtained in the operating room. Portions of a right hip arthroplasty are in place. Fluoroscopy time 12 seconds. Dose 12.2 mGy. IMPRESSION: Intraoperative fluoroscopy during right hip arthroplasty. Electronically Signed   By: 07/02/2022 M.D.   On: 06/30/2022 17:54   DG C-Arm 1-60 Min-No Report  Result Date: 06/30/2022 Fluoroscopy was utilized by the requesting physician.  No radiographic interpretation.   DG Chest Port 1 View  Result Date: 06/29/2022 CLINICAL DATA:  Fall, hip fracture EXAM: PORTABLE CHEST 1 VIEW COMPARISON:  11/13/2021 FINDINGS: The heart size and mediastinal contours are within normal limits. Both lungs are clear. The visualized skeletal structures are unremarkable. IMPRESSION: No active disease. Electronically Signed   By: 01/13/2022 D.O.   On: 06/29/2022 13:30   DG Hip Unilat  With Pelvis 2-3 Views Right  Result Date: 06/29/2022 CLINICAL  DATA:  Trauma, fall EXAM: DG HIP (WITH OR WITHOUT PELVIS) 2-3V RIGHT COMPARISON:  None Available. FINDINGS: Subcapital fracture is seen in the neck of right femur. Lays over riding of fracture fragments. There is no dislocation. IMPRESSION: Displaced comminuted  fracture is seen in the neck of right femur. Electronically Signed   By: Ernie Avena M.D.   On: 06/29/2022 11:40     LOS: 2 days   Lanae Boast, MD [facility]  07/01/2022, 11:03 AM

## 2022-07-01 NOTE — Progress Notes (Signed)
Physical Therapy Treatment Patient Details Name: Denise Hartman MRN: [id number] DOB: Jul 17, 1956 Today's Date: 07/01/2022   History of Present Illness 66 y.o. female with medical history significant for type 2 diabetes, hypertension, anxiety, and GERD who was brought to the ED via EMS today after she had a fall at her church in [place].  She stumbled over one of the steps and fell on her right side and started to complain of right-sided hip pain. Dx of R hip fx, s/p AA-THA 06/30/22.    PT Comments    Pt tolerated increased ambulation distance of 180' with RW, no loss of balance and 2/10 R hip pain. She performed R hip AAROM exercises with supervision. She is progressing well with mobility and I expect will be ready to DC home tomorrow from a PT standpoint.     Recommendations for follow up therapy are one component of a multi-disciplinary discharge planning process, led by the attending physician.  Recommendations may be updated based on patient status, additional functional criteria and insurance authorization.  Follow Up Recommendations  Home health PT     Assistance Recommended at Discharge Intermittent Supervision/Assistance  Patient can return home with the following A little help with bathing/dressing/bathroom;Assistance with cooking/housework;Help with stairs or ramp for entrance;Assist for transportation   Equipment Recommendations  Rolling walker (2 wheels);BSC/3in1    Recommendations for Other Services       Precautions / Restrictions Precautions Precautions: Fall Restrictions Weight Bearing Restrictions: No RLE Weight Bearing: Weight bearing as tolerated     Mobility  Bed Mobility Overal bed mobility: Modified Independent, Needs Assistance Bed Mobility: Sit to Supine       Sit to supine: Min assist   General bed mobility comments: min A RLE into bed    Transfers Overall transfer level: Needs assistance Equipment used: Rolling walker (2 wheels) Transfers: Sit  to/from Stand Sit to Stand: Supervision           General transfer comment: VCs hand placement    Ambulation/Gait Ambulation/Gait assistance: Min guard Gait Distance (Feet): 180 Feet Assistive device: Rolling walker (2 wheels) Gait Pattern/deviations: Step-to pattern, Decreased step length - right, Decreased step length - left Gait velocity: decr     General Gait Details: steady, no loss of balance   Stairs             Wheelchair Mobility    Modified Rankin (Stroke Patients Only)       Balance Overall balance assessment: Modified Independent                                          Cognition Arousal/Alertness: Awake/alert Behavior During Therapy: WFL for tasks assessed/performed Overall Cognitive Status: Within Functional Limits for tasks assessed                                          Exercises Total Joint Exercises Ankle Circles/Pumps: AROM, Both, 10 reps, Supine Heel Slides: AAROM, Right, 15 reps, Supine Hip ABduction/ADduction: AAROM, Right, 15 reps, Supine    General Comments        Pertinent Vitals/Pain Pain Assessment Pain Assessment: 0-10 Pain Score: 2  Pain Location: R hip Pain Descriptors / Indicators: Sore Pain Intervention(s): Limited activity within patient's tolerance, Monitored during session, Premedicated before session, Ice applied  Home Living Family/patient expects to be discharged to:: Private residence Living Arrangements: Other relatives Available Help at Discharge: Family;Available 24 hours/day Type of Home: Mobile home Home Access: Stairs to enter Entrance Stairs-Rails: Left Entrance Stairs-Number of Steps: 5   Home Layout: One level Home Equipment: None Additional Comments: 2 other falls in past 6 months (one stepped in a hole in the yard, other missed step on stairs)    Prior Function            PT Goals (current goals can now be found in the care plan section) Acute  Rehab PT Goals Patient Stated Goal: to be able to drive, do housework PT Goal Formulation: With patient Time For Goal Achievement: 07/08/22 Potential to Achieve Goals: Good Progress towards PT goals: Progressing toward goals    Frequency    Min 6X/week      PT Plan Current plan remains appropriate    Co-evaluation              AM-PAC PT "6 Clicks" Mobility   Outcome Measure  Help needed turning from your back to your side while in a flat bed without using bedrails?: A Little Help needed moving from lying on your back to sitting on the side of a flat bed without using bedrails?: A Little Help needed moving to and from a bed to a chair (including a wheelchair)?: A Little Help needed standing up from a chair using your arms (e.g., wheelchair or bedside chair)?: None Help needed to walk in hospital room?: A Little Help needed climbing 3-5 steps with a railing? : A Lot 6 Click Score: 18    End of Session Equipment Utilized During Treatment: Gait belt Activity Tolerance: Patient tolerated treatment well Patient left: with call bell/phone within reach;in bed;with bed alarm set Nurse Communication: Mobility status PT Visit Diagnosis: Difficulty in walking, not elsewhere classified (R26.2);Pain Pain - Right/Left: Right Pain - part of body: Hip     Time: [id number] PT Time Calculation (min) (ACUTE ONLY): 14 min  Charges:  $Gait Training: 8-22 mins                    Blondell Reveal Kistler PT 07/01/2022  Acute Rehabilitation Services  Office [phone]

## 2022-07-01 NOTE — Inpatient Diabetes Management (Addendum)
Inpatient Diabetes Program Recommendations  AACE/ADA: New Consensus Statement on Inpatient Glycemic Control (2015)  Target Ranges:  Prepandial:   less than 140 mg/dL      Peak postprandial:   less than 180 mg/dL (1-2 hours)      Critically ill patients:  140 - 180 mg/dL   Lab Results  Component Value Date   GLUCAP 165 (H) 07/01/2022   HGBA1C 9.6 (H) 06/26/2022    Review of Glycemic Control  Latest Reference Range & Units 06/30/22 19:52 07/01/22 00:24 07/01/22 03:55  Glucose-Capillary 70 - 99 mg/dL 093 (H) 267 (H) 124 (H)  (H): Data is abnormally high Diabetes history: type 2 DM Outpatient Diabetes medications: Actos 30 mg QD, Metformin 1000 mg BID, Trulicity 0.75 mg Qwk (NT) Current orders for Inpatient glycemic control: Novolog 0-15 units Q4H   Inpatient Diabetes Program Recommendations:     Consider: - adding Semglee 12 units QD.  - Changing correction to Novolog 0-15 units TID & HS - Carb modified diet.   Addendum: spoke with patient briefly regarding outpatient diabetes management. Patient reports recently had steroid injection for shoulder which she felt is driving up P8K.  Patient sleepy during encounter.  Reviewed briefly patient's current A1c of 9.6%. Explained what a A1c is and what it measures. Also reviewed goal A1c with patient, importance of good glucose control @ home, and blood sugar goals. Patient reports having a meter and supplies. Will plan to make a PCP follow up appointment.  Encouraged to be mindful of CHO intake and reviewed alternatives to sugary beverages. No questions at this time.  Thanks, Lujean Rave, MSN, RNC-OB Diabetes Coordinator [phone] (8a-5p)

## 2022-07-01 NOTE — TOC Progression Note (Addendum)
Transition of Care [facility]) - Progression Note    Patient Details  Name: Denise Hartman MRN: [id number] Date of Birth: 11/04/1955  Transition of Care [facility]) CM/SW Contact  Coralyn Helling, [place] Phone Number: 07/01/2022, 11:45 AM  Clinical Narrative:     Patient agreeable to HHPT and DME. HHPT arranged with [facility]. Needs orders prior to dc. Equipment ordered from [facility]. [employer] will deliver to room.   Expected Discharge Plan: Home w Home Health Services Barriers to Discharge: Continued Medical Work up  Expected Discharge Plan and Services Expected Discharge Plan: Home w Home Health Services In-house Referral: Clinical Social Work   Post Acute Care Choice: Home Health, Durable Medical Equipment Living arrangements for the past 2 months: Single Family Home                 DME Arranged: Walker rolling with seat   Date DME Agency Contacted: 07/01/22 Time DME Agency Contacted: 1145 Representative spoke with at DME Agency: [facility] HH Arranged: PT HH Agency: [facility] Date [facility] Agency Contacted: 07/01/22 Time HH Agency Contacted: 1145 Representative spoke with at [facility] Agency: Amy   Social Determinants of Health (SDOH) Interventions Food Insecurity Interventions: Patient Refused Housing Interventions: Patient Refused Transportation Interventions: Patient Refused Utilities Interventions: Patient Refused  Readmission Risk Interventions    06/30/2022    1:56 PM  Readmission Risk Prevention Plan  Post Dischage Appt Complete  Medication Screening Complete  Transportation Screening Complete

## 2022-07-01 NOTE — Progress Notes (Addendum)
   Subjective: 1 Day Post-Op Procedure(s) (LRB): RIGHT TOTAL HIP ARTHROPLASTY ANTERIOR APPROACH (Right) Patient reports pain as mild.   Patient seen in rounds for Dr. Charlann Boxer. Patient is sleeping in bed on exam. No acute events overnight. Has not been up with PT.  We will start therapy today.   Objective: Vital signs in last 24 hours: Temp:  [98 F (36.7 C)-99.8 F (37.7 C)] 98 F (36.7 C) (11/21 0604) Pulse Rate:  [70-88] 77 (11/21 0604) Resp:  [11-29] 18 (11/21 0604) BP: (90-138)/(43-68) 91/43 (11/21 0604) SpO2:  [91 %-99 %] 96 % (11/21 0604) Weight:  [81 kg] 81 kg (11/20 1425)  Intake/Output from previous day:  Intake/Output Summary (Last 24 hours) at 07/01/2022 0743 Last data filed at 07/01/2022 0601 Gross per 24 hour  Intake 3290.11 ml  Output 2500 ml  Net 790.11 ml     Intake/Output this shift: No intake/output data recorded.  Labs: Recent Labs    06/29/22 1335 06/30/22 0456 07/01/22 0434  HGB 12.6 12.8 9.2*   Recent Labs    06/30/22 0456 07/01/22 0434  WBC 6.1 12.5*  RBC 4.38 3.14*  HCT 40.0 28.8*  PLT 170 157   Recent Labs    06/30/22 0456 07/01/22 0434  NA 135 133*  K 4.0 4.5  CL 98 98  CO2 31 28  BUN 18 31*  CREATININE 0.60 1.57*  GLUCOSE 187* 207*  CALCIUM 9.0 7.9*   No results for input(s): "LABPT", "INR" in the last 72 hours.  Exam: General - Patient is Alert and Oriented Extremity - Neurologically intact Sensation intact distally Intact pulses distally Dorsiflexion/Plantar flexion intact Dressing - dressing C/D/I Motor Function - intact, moving foot and toes well on exam.   Past Medical History:  Diagnosis Date   Diabetes mellitus    Diverticulitis    Hypertension     Assessment/Plan: 1 Day Post-Op Procedure(s) (LRB): RIGHT TOTAL HIP ARTHROPLASTY ANTERIOR APPROACH (Right) Principal Problem:   Hip fracture (HCC) Active Problems:   Depression with anxiety   Gastroesophageal reflux disease without esophagitis    Hypertension   Mixed hyperlipidemia   Overweight with body mass index (BMI) of 28 to 28.9 in adult   Type 2 diabetes mellitus with diabetic polyneuropathy, without long-term current use of insulin (HCC)   S/P total right hip arthroplasty  Estimated body mass index is 27.97 kg/m as calculated from the following:   Height as of this encounter: 5\' 7"  (1.702 m).   Weight as of this encounter: 81 kg. Advance diet Up with therapy  DVT Prophylaxis - Aspirin 81 mg BID x4 weeks Weight bearing as tolerated.  Hgb stable at 9.2 this AM. Cr. Slightly elevated at 1.57 this AM.  Up with PT today.  Will use cefadroxil x2 weeks post op due to elevated A1c of >9.   She will hopefully be able to d/c home with her cousin who she lives with at some point.  , PA-C Orthopedic Surgery [phone] 07/01/2022, 7:43 AM

## 2022-07-01 NOTE — Progress Notes (Signed)
Pt is requesting to change where she will be D/C to. She is wanting to stay with a different family member that resides in [place], [place].

## 2022-07-01 NOTE — Evaluation (Signed)
Physical Therapy Evaluation Patient Details Name: Denise Hartman MRN: [id number] DOB: 1955/09/27 Today's Date: 07/01/2022  History of Present Illness  66 y.o. female with medical history significant for type 2 diabetes, hypertension, anxiety, and GERD who was brought to the ED via EMS today after she had a fall at her church in [place].  She stumbled over one of the steps and fell on her right side and started to complain of right-sided hip pain. Dx of R hip fx, s/p AA-THA 06/30/22.  Clinical Impression  Pt admitted with above diagnosis. Pt ambulated 99' with RW, no loss of balance. Initiated THA HEP. Good progress expected.  Pt currently with functional limitations due to the deficits listed below (see PT Problem List). Pt will benefit from skilled PT to increase their independence and safety with mobility to allow discharge to the venue listed below.          Recommendations for follow up therapy are one component of a multi-disciplinary discharge planning process, led by the attending physician.  Recommendations may be updated based on patient status, additional functional criteria and insurance authorization.  Follow Up Recommendations Home health PT      Assistance Recommended at Discharge Intermittent Supervision/Assistance  Patient can return home with the following  A little help with bathing/dressing/bathroom;Assistance with cooking/housework;Help with stairs or ramp for entrance;Assist for transportation    Equipment Recommendations Rolling walker (2 wheels);BSC/3in1  Recommendations for Other Services       Functional Status Assessment Patient has had a recent decline in their functional status and demonstrates the ability to make significant improvements in function in a reasonable and predictable amount of time.     Precautions / Restrictions Precautions Precautions: Fall Restrictions Weight Bearing Restrictions: No RLE Weight Bearing: Weight bearing as tolerated       Mobility  Bed Mobility Overal bed mobility: Modified Independent             General bed mobility comments: HOB up, used rail    Transfers Overall transfer level: Needs assistance Equipment used: Rolling walker (2 wheels) Transfers: Sit to/from Stand Sit to Stand: Min guard           General transfer comment: VCs hand placement    Ambulation/Gait Ambulation/Gait assistance: Min guard Gait Distance (Feet): 80 Feet Assistive device: Rolling walker (2 wheels) Gait Pattern/deviations: Step-to pattern, Decreased step length - right, Decreased step length - left Gait velocity: decr     General Gait Details: VCs sequencing, no loss of balance  Stairs            Wheelchair Mobility    Modified Rankin (Stroke Patients Only)       Balance Overall balance assessment: Modified Independent                                           Pertinent Vitals/Pain Pain Assessment Pain Assessment: 0-10 Pain Score: 3  Pain Location: R hip Pain Descriptors / Indicators: Operative site guarding Pain Intervention(s): Limited activity within patient's tolerance, Monitored during session, Premedicated before session, Ice applied    Home Living Family/patient expects to be discharged to:: Private residence Living Arrangements: Other relatives Available Help at Discharge: Family;Available 24 hours/day Type of Home: Mobile home Home Access: Stairs to enter Entrance Stairs-Rails: Left Entrance Stairs-Number of Steps: 5   Home Layout: One level Home Equipment: None Additional Comments: 2 other falls  in past 6 months (one stepped in a hole in the yard, other missed step on stairs)    Prior Function Prior Level of Function : Independent/Modified Independent;History of Falls (last six months)             Mobility Comments: walks without AD, 2 other falls in 6 months ADLs Comments: independent     Hand Dominance        Extremity/Trunk Assessment    Upper Extremity Assessment Upper Extremity Assessment: Overall WFL for tasks assessed    Lower Extremity Assessment Lower Extremity Assessment: RLE deficits/detail RLE Deficits / Details: knee ext -3/5, hip +2/5 RLE Sensation: WNL RLE Coordination: WNL    Cervical / Trunk Assessment Cervical / Trunk Assessment: Normal  Communication   Communication: No difficulties  Cognition Arousal/Alertness: Awake/alert Behavior During Therapy: WFL for tasks assessed/performed Overall Cognitive Status: Within Functional Limits for tasks assessed                                          General Comments      Exercises Total Joint Exercises Ankle Circles/Pumps: AROM, Both, 10 reps, Supine Heel Slides: AAROM, Right, 15 reps, Supine Hip ABduction/ADduction: AAROM, Right, 15 reps, Supine   Assessment/Plan    PT Assessment Patient needs continued PT services  PT Problem List Decreased strength;Decreased mobility;Decreased activity tolerance;Pain       PT Treatment Interventions DME instruction;Gait training;Stair training;Functional mobility training;Therapeutic exercise;Therapeutic activities;Patient/family education    PT Goals (Current goals can be found in the Care Plan section)  Acute Rehab PT Goals Patient Stated Goal: to be able to drive, do housework PT Goal Formulation: With patient Time For Goal Achievement: 07/08/22 Potential to Achieve Goals: Good    Frequency Min 6X/week     Co-evaluation               AM-PAC PT "6 Clicks" Mobility  Outcome Measure Help needed turning from your back to your side while in a flat bed without using bedrails?: A Little Help needed moving from lying on your back to sitting on the side of a flat bed without using bedrails?: A Little Help needed moving to and from a bed to a chair (including a wheelchair)?: A Little Help needed standing up from a chair using your arms (e.g., wheelchair or bedside chair)?:  None Help needed to walk in hospital room?: A Little Help needed climbing 3-5 steps with a railing? : A Lot 6 Click Score: 18    End of Session Equipment Utilized During Treatment: Gait belt Activity Tolerance: Patient tolerated treatment well Patient left: in chair;with chair alarm set;with call bell/phone within reach Nurse Communication: Mobility status PT Visit Diagnosis: Difficulty in walking, not elsewhere classified (R26.2);Pain Pain - Right/Left: Right Pain - part of body: Hip    Time: 1001-1026 PT Time Calculation (min) (ACUTE ONLY): 25 min   Charges:   PT Evaluation $PT Eval Moderate Complexity: 1 Mod PT Treatments $Gait Training: 8-22 mins       Ralene Bathe Kistler PT 07/01/2022  Acute Rehabilitation Services  Office [phone]

## 2022-07-02 DIAGNOSIS — S72001A Fracture of unspecified part of neck of right femur, initial encounter for closed fracture: Secondary | ICD-10-CM | POA: Diagnosis not present

## 2022-07-02 LAB — GLUCOSE, CAPILLARY
Glucose-Capillary: 189 mg/dL — ABNORMAL HIGH (ref 70–99)
Glucose-Capillary: 168 mg/dL — ABNORMAL HIGH (ref 70–99)
Glucose-Capillary: 343 mg/dL — ABNORMAL HIGH (ref 70–99)
Glucose-Capillary: 198 mg/dL — ABNORMAL HIGH (ref 70–99)
Glucose-Capillary: 291 mg/dL — ABNORMAL HIGH (ref 70–99)

## 2022-07-02 LAB — FOLATE: Folate: 5.6 ng/mL — ABNORMAL LOW (ref >5.9)

## 2022-07-02 LAB — BASIC METABOLIC PANEL
Anion gap: 3 — ABNORMAL LOW (ref 5–15)
BUN: 31 mg/dL — ABNORMAL HIGH (ref 8–23)
CO2: 27 mmol/L (ref 22–32)
Calcium: 7.4 mg/dL — ABNORMAL LOW (ref 8.9–10.3)
Chloride: 109 mmol/L (ref 98–111)
Creatinine, Ser: 0.66 mg/dL (ref 0.44–1.00)
GFR, Estimated: >60 mL/min (ref >60)
Glucose, Bld: 199 mg/dL — ABNORMAL HIGH (ref 70–99)
Potassium: 4.5 mmol/L (ref 3.5–5.1)
Sodium: 139 mmol/L (ref 135–145)

## 2022-07-02 LAB — CBC
HCT: 21.6 % — ABNORMAL LOW (ref 36.0–46.0)
Hemoglobin: 6.9 g/dL — CL (ref 12.0–15.0)
MCH: 29.5 pg (ref 26.0–34.0)
MCHC: 31.9 g/dL (ref 30.0–36.0)
MCV: 92.3 fL (ref 80.0–100.0)
Platelets: 104 10*3/uL — ABNORMAL LOW (ref 150–400)
RBC: 2.34 MIL/uL — ABNORMAL LOW (ref 3.87–5.11)
RDW: 13.6 % (ref 11.5–15.5)
WBC: 7 10*3/uL (ref 4.0–10.5)
nRBC: 0 % (ref 0.0–0.2)

## 2022-07-02 LAB — IRON AND TIBC
Iron: 34 ug/dL (ref 28–170)
Saturation Ratios: 13 % (ref 10.4–31.8)
TIBC: 266 ug/dL (ref 250–450)
UIBC: 232 ug/dL

## 2022-07-02 LAB — VITAMIN B12: Vitamin B-12: 198 pg/mL (ref 180–914)

## 2022-07-02 LAB — FERRITIN: Ferritin: 242 ng/mL (ref 11–307)

## 2022-07-02 LAB — ABO/RH: ABO/RH(D): A POS

## 2022-07-02 LAB — PREPARE RBC (CROSSMATCH)

## 2022-07-02 MED ORDER — METHOCARBAMOL 500 MG PO TABS: 500.0000 mg | ORAL_TABLET | Freq: Four times a day (QID) | ORAL | 2 refills | Status: DC | PRN

## 2022-07-02 MED ORDER — VITAMIN B-12 1000 MCG PO TABS
1000.0000 ug | ORAL_TABLET | Freq: Every day | ORAL | Status: DC
  Administered 2022-07-02 – 2022-07-03 (×2): 1000 ug via ORAL
  Filled 2022-07-02 (×2): qty 1

## 2022-07-02 MED ORDER — FOLIC ACID 1 MG PO TABS
1.0000 mg | ORAL_TABLET | Freq: Every day | ORAL | Status: DC
  Administered 2022-07-02 – 2022-07-03 (×2): 1 mg via ORAL
  Filled 2022-07-02 (×2): qty 1

## 2022-07-02 MED ORDER — POLYETHYLENE GLYCOL 3350 17 G PO PACK: 17.0000 g | PACK | Freq: Two times a day (BID) | ORAL | 0 refills | Status: DC

## 2022-07-02 MED ORDER — HYDROCODONE-ACETAMINOPHEN 5-325 MG PO TABS: 1.0000 | ORAL_TABLET | ORAL | 0 refills | Status: DC | PRN

## 2022-07-02 MED ORDER — ASPIRIN 81 MG PO CHEW: 81.0000 mg | CHEWABLE_TABLET | Freq: Two times a day (BID) | ORAL | 0 refills | Status: DC

## 2022-07-02 MED ORDER — DOCUSATE SODIUM 100 MG PO CAPS: 100.0000 mg | ORAL_CAPSULE | Freq: Two times a day (BID) | ORAL | 0 refills | Status: DC

## 2022-07-02 MED ORDER — SODIUM CHLORIDE 0.9% IV SOLUTION: Freq: Once | INTRAVENOUS | Status: AC

## 2022-07-02 MED ORDER — CEFADROXIL 500 MG PO CAPS: 500.0000 mg | ORAL_CAPSULE | Freq: Two times a day (BID) | ORAL | 0 refills | Status: DC

## 2022-07-02 NOTE — Progress Notes (Signed)
NP Garner Nash was notified of critical lab value, hgb 6.9

## 2022-07-02 NOTE — Progress Notes (Signed)
PROGRESS NOTE Denise Hartman  [id number] DOB: 03/02/1956 DOA: 06/29/2022 PCP: Coral Spikes, DO   Brief Narrative/Hospital Course: 66 year old female with T2DM HTN anxiety, GERD seen after a fall after a fall found to have right femur fracture comminuted and displaced, Dr. Gwyndolyn Saxon from orthopedic was discussed and transferred to [facility] hospital from [facility] for operative intervention. 11/20: s/p Right THA>.  Significant AKI noted postop, also had soft blood pressure. 11/22: Creatinine improved with IV fluids but hemoglobin drifting  Subjective: Seen and examined this morning. Has no complaint resting comfortably. Overnight afebrile BP remained stable 120s/50s with IV fluids Labs reviewed show significant anemia 6.9 g, AKI resolved 1 unit PRBC has been ordered  Assessment and Plan: Principal Problem:   Hip fracture ([place]) Active Problems:   Depression with anxiety   Gastroesophageal reflux disease without esophagitis   Hypertension   Mixed hyperlipidemia   Overweight with body mass index (BMI) of 28 to 28.9 in adult   Type 2 diabetes mellitus with diabetic polyneuropathy, without long-term current use of insulin (HCC)   S/P total right hip arthroplasty   Right femoral neck fracture secondary to fall: 11/20: s/p Right THA.  Continue DVT prophylaxis, pain control PT OT as per orthopedics> overall doing well planning for home health PT OT. Ortho recs ASA 81 mg bid x 4wk, also on cefadroxil postop x2 weeks.  Acute blood loss anemia Folate deficiency anemia: Suspect multifactorial due to hip fracture/surgery, aggressive fluid resuscitation for AKI, folate deficieicny, borderline b12. also hemodilution contributing some.  Transfusing 1 unit PRBC, checked anemia panel as low b12- repleting with folate and b12 PO. Cbc in am. Recent Labs    03/03/22 1035 06/29/22 1335 06/30/22 0456 07/01/22 0434 07/02/22 0438  HGB 13.2 12.6 12.8 9.2* 6.9*  MCV 86 89.2 91.3 91.7 92.3  VITAMINB12  --   --    --   --  198  FOLATE  --   --   --   --  5.6*  FERRITIN  --   --   --   --  242  TIBC  --   --   --   --  266  IRON  --   --   --   --  34    AKI: Likely multifactorial in the setting of #1/acute blood loss anemia/dehydration/hypotension.managed with aggressive IV fluid hydration and AKI has resolved.  Dc ivf. Recent Labs    11/13/21 2113 03/03/22 1035 06/29/22 1335 06/30/22 0456 07/01/22 0434 07/02/22 0438  BUN 17 15 22 18  31* 31*  CREATININE 0.74 0.68 0.65 0.60 1.57* 0.66    Hypotension/soft blood pressure: resolved /w ivf. Acei on hold.    Type II diabetes mellitus: Blood sugar poorly controlled> placed on Lantus 12 units, SSI. Monitor as below.  See #extraneously recently that may be contributing to her high blood sugar as per the patient on discharge she will resume her Actos and metformin Trulicity with close monitoring of blood glucose if remains poorly controlled we will need to consider insulin. Recent Labs  Lab 06/26/22 1354 06/29/22 2004 07/01/22 1605 07/01/22 2006 07/01/22 2342 07/02/22 0354 07/02/22 0730  GLUCAP  --    < > 343* 352* 253* 189* 168*  HGBA1C 9.6*  --   --   --   --   --   --    < > = values in this interval not displayed.    Hypertension: BP  stable now-hold home lisinopril 2.5 mg GERD: Continue PPI.  Anxiety:Continue her Neurontin, Effexor  DVT prophylaxis: SCDs Start: 06/30/22 1834 Place TED hose Start: 06/30/22 1834.  Asked RN to hold Lovenox Code Status:   Code Status: Full Code Family Communication: plan of care discussed with patient at bedside. Patient status is: Inpatient because of a fracture  Level of care: Med-Surg  Dispo: The patient is from: home            Anticipated disposition:  HHPT.plan to go to her sisters house.  Anticipate discharge tomorrow if remains stable Objective: Vitals last 24 hrs: Vitals:   07/02/22 0515 07/02/22 0631 07/02/22 0706 07/02/22 1138  BP: (!) 123/57 (!) 130/55 (!) 129/53 (!) 110/50  Pulse: 82 84  84 80  Resp: 18 16 18 18   Temp: 98.3 F (36.8 C) 98.9 F (37.2 C) 99.7 F (37.6 C) 98.8 F (37.1 C)  TempSrc: Oral Oral Oral Oral  SpO2: 98% 94% 97% 95%  Weight:      Height:       Weight change:   Physical Examination: General exam: AAox3, weak,older appearing HEENT:Oral mucosa moist, Ear/Nose WNL grossly, dentition normal. Respiratory system: bilaterally cleardiminished BS, no use of accessory muscle Cardiovascular system: S1 & S2 +, regular rate. Gastrointestinal system: Abdomen soft, NT,ND,BS+ Nervous System:Alert, awake, moving extremities and grossly nonfocal Extremities: Right femoral incision site w/ aquacel dressing Skin: No rashes,no icterus. MSK: Normal muscle bulk,tone, power    Medications reviewed:  Scheduled Meds:  aspirin  81 mg Oral BID   cefadroxil  500 mg Oral BID   vitamin B-12  1,000 mcg Oral Daily   docusate sodium  100 mg Oral BID   folic acid  1 mg Oral Daily   gabapentin  600 mg Oral QHS   insulin aspart  0-15 Units Subcutaneous TID WC   insulin glargine-yfgn  12 Units Subcutaneous Daily   loratadine  10 mg Oral Daily   mupirocin ointment  1 Application Nasal BID   pantoprazole  40 mg Oral Daily   polyethylene glycol  17 g Oral BID   venlafaxine XR  150 mg Oral QHS   Continuous Infusions:  sodium chloride 50 mL/hr at 07/02/22 0813   methocarbamol (ROBAXIN) IV      Diet Order             Diet regular Room service appropriate? Yes; Fluid consistency: Thin  Diet effective now                  Intake/Output Summary (Last 24 hours) at 07/02/2022 1150 Last data filed at 07/02/2022 1139 Gross per 24 hour  Intake 4942.27 ml  Output 1800 ml  Net 3142.27 ml   Net IO Since Admission: 6,231.42 mL [07/02/22 1150]  Wt Readings from Last 3 Encounters:  06/30/22 81 kg  06/26/22 81.7 kg  03/25/22 83.5 kg     Unresulted Labs (From admission, onward)     Start     Ordered   07/06/22 0500  Creatinine, serum  (enoxaparin (LOVENOX)    CrCl  >/= 30 ml/min)  Weekly,   R     Comments: while on enoxaparin therapy    06/29/22 1501   07/02/22 1145  Glucose, capillary  Once,   R        07/02/22 1145   07/01/22 0500  Basic metabolic panel  Daily at 5am,   R      06/30/22 0744   07/01/22 0500  CBC  Daily at 5am,   R  06/30/22 0744          Data Reviewed: I have personally reviewed following labs and imaging studies CBC: Recent Labs  Lab 06/29/22 1335 06/30/22 0456 07/01/22 0434 07/02/22 0438  WBC 7.8 6.1 12.5* 7.0  NEUTROABS 4.8  --   --   --   HGB 12.6 12.8 9.2* 6.9*  HCT 39.0 40.0 28.8* 21.6*  MCV 89.2 91.3 91.7 92.3  PLT 163 170 157 123456*   Basic Metabolic Panel: Recent Labs  Lab 06/29/22 1335 06/30/22 0456 07/01/22 0434 07/02/22 0438  NA 136 135 133* 139  K 3.8 4.0 4.5 4.5  CL 98 98 98 109  CO2 25 31 28 27   GLUCOSE 165* 187* 207* 199*  BUN 22 18 31* 31*  CREATININE 0.65 0.60 1.57* 0.66  CALCIUM 9.8 9.0 7.9* 7.4*  MG  --  1.4*  --   --    GFR: Estimated Creatinine Clearance: 77.8 mL/min (by C-G formula based on SCr of 0.66 mg/dL). Liver Function Tests: Recent Labs  Lab 06/29/22 1335  AST 39  ALT 29  ALKPHOS 88  BILITOT 1.0  PROT 6.9  ALBUMIN 3.7  CBG: Recent Labs  Lab 07/01/22 1605 07/01/22 2006 07/01/22 2342 07/02/22 0354 07/02/22 0730  GLUCAP 343* 352* 253* 189* 168*   Antimicrobials: Anti-infectives (From admission, onward)    Start     Dose/Rate Route Frequency Ordered Stop   07/02/22 0000  cefadroxil (DURICEF) 500 MG capsule        500 mg Oral 2 times daily 07/02/22 0811 07/16/22 2359   07/01/22 1000  cefadroxil (DURICEF) capsule 500 mg        500 mg Oral 2 times daily 07/01/22 0718 07/08/22 0959   06/30/22 2200  ceFAZolin (ANCEF) IVPB 2g/100 mL premix        2 g 200 mL/hr over 30 Minutes Intravenous Every 6 hours 06/30/22 1833 07/01/22 0433   06/30/22 1200  ceFAZolin (ANCEF) IVPB 2g/100 mL premix        2 g 200 mL/hr over 30 Minutes Intravenous On call to O.R. 06/30/22  1113 06/30/22 1612      Culture/Microbiology    Component Value Date/Time   SDES URINE, RANDOM 09/21/2011 1552   SPECREQUEST NONE 09/21/2011 1552   CULT INSIGNIFICANT GROWTH 09/21/2011 1552   REPTSTATUS 09/22/2011 FINAL 09/21/2011 1552  Radiology Studies: DG Pelvis Portable  Result Date: 06/30/2022 CLINICAL DATA:  Right hip arthroplasty. EXAM: PORTABLE PELVIS 1-2 VIEWS COMPARISON:  None Available. FINDINGS: Right hip arthroplasty in expected alignment. No periprosthetic lucency or fracture. Recent postsurgical change includes air and edema in the soft tissues. IMPRESSION: Right hip arthroplasty without immediate postoperative complication. Electronically Signed   By: Keith Rake M.D.   On: 06/30/2022 18:39   DG HIP UNILAT WITH PELVIS 1V RIGHT  Result Date: 06/30/2022 CLINICAL DATA:  Right hip surgery. EXAM: DG HIP (WITH OR WITHOUT PELVIS) 1V RIGHT COMPARISON:  Preoperative radiographs FINDINGS: Three fluoroscopic spot views of the pelvis and right hip obtained in the operating room. Portions of a right hip arthroplasty are in place. Fluoroscopy time 12 seconds. Dose 12.2 mGy. IMPRESSION: Intraoperative fluoroscopy during right hip arthroplasty. Electronically Signed   By: Keith Rake M.D.   On: 06/30/2022 17:54   DG C-Arm 1-60 Min-No Report  Result Date: 06/30/2022 Fluoroscopy was utilized by the requesting physician.  No radiographic interpretation.     LOS: 3 days   Antonieta Pert, MD [facility]  07/02/2022, 11:50 AM

## 2022-07-02 NOTE — Progress Notes (Signed)
    OVERNIGHT PROGRESS REPORT  Notified for Hgb  Ordered 1 unit PRBCs  Chinita Greenland MSNA MSN ACNPC-AG Acute Care Nurse Practitioner [facility]

## 2022-07-02 NOTE — Progress Notes (Signed)
Physical Therapy Treatment Patient Details Name: Denise Hartman MRN: [id number] DOB: August 15, 1955 Today's Date: 07/02/2022   History of Present Illness 66 y.o. female with medical history significant for type 2 diabetes, hypertension, anxiety, and GERD who was brought to the ED via EMS today after she had a fall at her [employer] in [place].  She stumbled over one of the steps and fell on her right side and started to complain of right-sided hip pain. Dx of R hip fx, s/p AA-THA 06/30/22.    PT Comments    Pt ambulated 100' with RW, distance limited by 9/10 R hip pain. Instructed pt in THA HEP. Pt stated she may DC to her sister's home in [place] as her sister can provide more assistance than her cousin can (cousin is caring for spouse on dialysis). Noted Hgb 6.9 this morning, pt received 1 unit of blood prior to PT session.     Recommendations for follow up therapy are one component of a multi-disciplinary discharge planning process, led by the attending physician.  Recommendations may be updated based on patient status, additional functional criteria and insurance authorization.  Follow Up Recommendations  Home health PT     Assistance Recommended at Discharge Intermittent Supervision/Assistance  Patient can return home with the following A little help with bathing/dressing/bathroom;Assistance with cooking/housework;Help with stairs or ramp for entrance;Assist for transportation   Equipment Recommendations  Rolling walker (2 wheels);BSC/3in1    Recommendations for Other Services       Precautions / Restrictions Precautions Precautions: Fall Restrictions Weight Bearing Restrictions: No RLE Weight Bearing: Weight bearing as tolerated     Mobility  Bed Mobility               General bed mobility comments: up in recliner    Transfers Overall transfer level: Needs assistance Equipment used: Rolling walker (2 wheels) Transfers: Sit to/from Stand Sit to Stand: Supervision            General transfer comment: VCs hand placement    Ambulation/Gait Ambulation/Gait assistance: Supervision Gait Distance (Feet): 100 Feet Assistive device: Rolling walker (2 wheels) Gait Pattern/deviations: Step-to pattern, Decreased step length - right, Decreased step length - left Gait velocity: decr     General Gait Details: steady, no loss of balance, more pain today than yesterday, 9/10 pain with walking, distance limited by pain   Stairs             Wheelchair Mobility    Modified Rankin (Stroke Patients Only)       Balance Overall balance assessment: Modified Independent                                          Cognition Arousal/Alertness: Awake/alert Behavior During Therapy: WFL for tasks assessed/performed Overall Cognitive Status: Within Functional Limits for tasks assessed                                          Exercises Total Joint Exercises Ankle Circles/Pumps: AROM, Both, 10 reps, Supine Quad Sets: AROM, Right, 5 reps, Supine Short Arc Quad: AROM, Right, 10 reps Heel Slides: AAROM, Right, Supine, 10 reps Hip ABduction/ADduction: AAROM, Right, Supine, 10 reps    General Comments        Pertinent Vitals/Pain Pain Assessment Pain Score: 9  Pain Location: R hip Pain Descriptors / Indicators: Sore, Sharp Pain Intervention(s): Limited activity within patient's tolerance, Monitored during session, Patient requesting pain meds-RN notified, RN gave pain meds during session, Ice applied, Repositioned    Home Living                          Prior Function            PT Goals (current goals can now be found in the care plan section) Acute Rehab PT Goals Patient Stated Goal: to be able to drive, do housework PT Goal Formulation: With patient Time For Goal Achievement: 07/08/22 Potential to Achieve Goals: Good    Frequency    Min 6X/week      PT Plan Current plan remains  appropriate    Co-evaluation              AM-PAC PT "6 Clicks" Mobility   Outcome Measure  Help needed turning from your back to your side while in a flat bed without using bedrails?: A Little Help needed moving from lying on your back to sitting on the side of a flat bed without using bedrails?: A Little Help needed moving to and from a bed to a chair (including a wheelchair)?: A Little Help needed standing up from a chair using your arms (e.g., wheelchair or bedside chair)?: None Help needed to walk in hospital room?: None Help needed climbing 3-5 steps with a railing? : A Little 6 Click Score: 20    End of Session Equipment Utilized During Treatment: Gait belt Activity Tolerance: Patient tolerated treatment well Patient left: in chair;with call bell/phone within reach;with chair alarm set Nurse Communication: Mobility status PT Visit Diagnosis: Difficulty in walking, not elsewhere classified (R26.2);Pain Pain - Right/Left: Right Pain - part of body: Hip     Time: 8295-6213 PT Time Calculation (min) (ACUTE ONLY): 18 min  Charges:  $Gait Training: 8-22 mins                     Ralene Bathe Kistler PT 07/02/2022  Acute Rehabilitation Services  Office [phone]

## 2022-07-02 NOTE — Progress Notes (Signed)
Subjective: 2 Days Post-Op Procedure(s) (LRB): RIGHT TOTAL HIP ARTHROPLASTY ANTERIOR APPROACH (Right) Patient reports pain as mild.   Patient seen in rounds for Dr. Charlann Boxer. Patient is resting in bed on exam this morning. She is much less drowsy today. She tells me she is planning to go stay with her sister in [place] after discharge.  Ambulated 180 feet with PT.  We will continue therapy today.   Objective: Vital signs in last 24 hours: Temp:  [97.9 F (36.6 C)-99.7 F (37.6 C)] 99.7 F (37.6 C) (11/22 0706) Pulse Rate:  [75-84] 84 (11/22 0706) Resp:  [16-18] 18 (11/22 0706) BP: (95-130)/(45-57) 129/53 (11/22 0706) SpO2:  [92 %-99 %] 97 % (11/22 0706)  Intake/Output from previous day:  Intake/Output Summary (Last 24 hours) at 07/02/2022 0802 Last data filed at 07/02/2022 0600 Gross per 24 hour  Intake 5074.81 ml  Output 1725 ml  Net 3349.81 ml     Intake/Output this shift: No intake/output data recorded.  Labs: Recent Labs    06/29/22 1335 06/30/22 0456 07/01/22 0434 07/02/22 0438  HGB 12.6 12.8 9.2* 6.9*   Recent Labs    07/01/22 0434 07/02/22 0438  WBC 12.5* 7.0  RBC 3.14* 2.34*  HCT 28.8* 21.6*  PLT 157 104*   Recent Labs    07/01/22 0434 07/02/22 0438  NA 133* 139  K 4.5 4.5  CL 98 109  CO2 28 27  BUN 31* 31*  CREATININE 1.57* 0.66  GLUCOSE 207* 199*  CALCIUM 7.9* 7.4*   No results for input(s): "LABPT", "INR" in the last 72 hours.  Exam: General - Patient is Alert and Oriented Extremity - Neurologically intact Sensation intact distally Intact pulses distally Dorsiflexion/Plantar flexion intact Dressing - dressing C/D/I Motor Function - intact, moving foot and toes well on exam.   Past Medical History:  Diagnosis Date   Diabetes mellitus    Diverticulitis    Hypertension     Assessment/Plan: 2 Days Post-Op Procedure(s) (LRB): RIGHT TOTAL HIP ARTHROPLASTY ANTERIOR APPROACH (Right) Principal Problem:   Hip fracture  (HCC) Active Problems:   Depression with anxiety   Gastroesophageal reflux disease without esophagitis   Hypertension   Mixed hyperlipidemia   Overweight with body mass index (BMI) of 28 to 28.9 in adult   Type 2 diabetes mellitus with diabetic polyneuropathy, without long-term current use of insulin (HCC)   S/P total right hip arthroplasty  Estimated body mass index is 27.97 kg/m as calculated from the following:   Height as of this encounter: 5\' 7"  (1.702 m).   Weight as of this encounter: 81 kg. Advance diet Up with therapy  DVT Prophylaxis - Aspirin 81 BID x4 weeks Weight bearing as tolerated.  ABLA: Hgb 6.9 this AM. Receiving blood currently. No signs of bleeding at the incision. Compartment soft. 500 cc blood loss intra-op.   We had a long discussion about managing her blood sugar at home, particularly for the first few weeks post op. This is a major risk factor for infection, and she understands the importance of it. Will keep on cefadroxil for 2 weeks.   Plan for discharge home with her sister in whenever medically stable and meeting goals with PT. She did very well with PT today, but may be limited today due to receiving blood.  Orthopaedics will sign off, but available to re-evaluate at any time. We will see her in 2 weeks in the office.     [place], PA-C Orthopedic Surgery [phone])  [id number] 07/02/2022, 8:02 AM

## 2022-07-03 DIAGNOSIS — S72001A Fracture of unspecified part of neck of right femur, initial encounter for closed fracture: Secondary | ICD-10-CM | POA: Diagnosis not present

## 2022-07-03 LAB — BASIC METABOLIC PANEL
Anion gap: 2 — ABNORMAL LOW (ref 5–15)
BUN: 21 mg/dL (ref 8–23)
CO2: 28 mmol/L (ref 22–32)
Calcium: 8 mg/dL — ABNORMAL LOW (ref 8.9–10.3)
Chloride: 107 mmol/L (ref 98–111)
Creatinine, Ser: 0.62 mg/dL (ref 0.44–1.00)
GFR, Estimated: >60 mL/min (ref >60)
Glucose, Bld: 174 mg/dL — ABNORMAL HIGH (ref 70–99)
Potassium: 3.9 mmol/L (ref 3.5–5.1)
Sodium: 137 mmol/L (ref 135–145)

## 2022-07-03 LAB — CBC
HCT: 25.9 % — ABNORMAL LOW (ref 36.0–46.0)
Hemoglobin: 8.4 g/dL — ABNORMAL LOW (ref 12.0–15.0)
MCH: 29.3 pg (ref 26.0–34.0)
MCHC: 32.4 g/dL (ref 30.0–36.0)
MCV: 90.2 fL (ref 80.0–100.0)
Platelets: 140 10*3/uL — ABNORMAL LOW (ref 150–400)
RBC: 2.87 MIL/uL — ABNORMAL LOW (ref 3.87–5.11)
RDW: 14 % (ref 11.5–15.5)
WBC: 6 10*3/uL (ref 4.0–10.5)
nRBC: 0 % (ref 0.0–0.2)

## 2022-07-03 LAB — GLUCOSE, CAPILLARY
Glucose-Capillary: 159 mg/dL — ABNORMAL HIGH (ref 70–99)
Glucose-Capillary: 163 mg/dL — ABNORMAL HIGH (ref 70–99)
Glucose-Capillary: 174 mg/dL — ABNORMAL HIGH (ref 70–99)
Glucose-Capillary: 210 mg/dL — ABNORMAL HIGH (ref 70–99)
Glucose-Capillary: 257 mg/dL — ABNORMAL HIGH (ref 70–99)

## 2022-07-03 LAB — TYPE AND SCREEN
ABO/RH(D): A POS
Antibody Screen: NEGATIVE
Unit division: 0

## 2022-07-03 LAB — BPAM RBC
Blood Product Expiration Date: 202312132359
ISSUE DATE / TIME: 202311220638
Unit Type and Rh: 6200

## 2022-07-03 MED ORDER — HYDROCODONE-ACETAMINOPHEN 5-325 MG PO TABS: 1.0000 | ORAL_TABLET | ORAL | 0 refills | Status: DC | PRN

## 2022-07-03 MED ORDER — POLYETHYLENE GLYCOL 3350 17 G PO PACK: 17.0000 g | PACK | Freq: Two times a day (BID) | ORAL | 0 refills | Status: DC

## 2022-07-03 MED ORDER — ASPIRIN 81 MG PO CHEW: 81.0000 mg | CHEWABLE_TABLET | Freq: Two times a day (BID) | ORAL | 0 refills | Status: DC

## 2022-07-03 MED ORDER — FOLIC ACID 1 MG PO TABS: 1.0000 mg | ORAL_TABLET | Freq: Every day | ORAL | 0 refills | Status: AC

## 2022-07-03 MED ORDER — VITAMIN B-12 500 MCG PO TABS: 500.0000 ug | ORAL_TABLET | Freq: Every day | ORAL | 0 refills | Status: DC

## 2022-07-03 MED ORDER — DOCUSATE SODIUM 100 MG PO CAPS: 100.0000 mg | ORAL_CAPSULE | Freq: Two times a day (BID) | ORAL | 0 refills | Status: DC

## 2022-07-03 MED ORDER — INSULIN DETEMIR 100 UNIT/ML FLEXPEN: 10.0000 [IU] | PEN_INJECTOR | Freq: Every day | SUBCUTANEOUS | 0 refills | Status: DC

## 2022-07-03 MED ORDER — CEFADROXIL 500 MG PO CAPS: 500.0000 mg | ORAL_CAPSULE | Freq: Two times a day (BID) | ORAL | 0 refills | Status: DC

## 2022-07-03 MED ORDER — VITAMIN B-12 500 MCG PO TABS: 500.0000 ug | ORAL_TABLET | Freq: Every day | ORAL | 0 refills | Status: AC

## 2022-07-03 MED ORDER — METHOCARBAMOL 500 MG PO TABS: 500.0000 mg | ORAL_TABLET | Freq: Four times a day (QID) | ORAL | 2 refills | Status: DC | PRN

## 2022-07-03 MED ORDER — INSULIN PEN NEEDLE 31G X 5 MM MISC: 1.0000 | Freq: Every day | 0 refills | Status: DC

## 2022-07-03 MED ORDER — METHOCARBAMOL 500 MG PO TABS: 500.0000 mg | ORAL_TABLET | Freq: Four times a day (QID) | ORAL | 2 refills | Status: AC | PRN

## 2022-07-03 MED ORDER — INSULIN DETEMIR 100 UNIT/ML FLEXPEN: 10.0000 [IU] | PEN_INJECTOR | Freq: Every day | SUBCUTANEOUS | 0 refills | Status: AC

## 2022-07-03 MED ORDER — ASPIRIN 81 MG PO CHEW: 81.0000 mg | CHEWABLE_TABLET | Freq: Two times a day (BID) | ORAL | 0 refills | Status: AC

## 2022-07-03 MED ORDER — INSULIN PEN NEEDLE 31G X 5 MM MISC: 1.0000 | Freq: Every day | 0 refills | Status: AC

## 2022-07-03 MED ORDER — FOLIC ACID 1 MG PO TABS: 1.0000 mg | ORAL_TABLET | Freq: Every day | ORAL | 0 refills | Status: DC

## 2022-07-03 NOTE — TOC Progression Note (Signed)
Transition of Care [facility]) - Progression Note    Patient Details  Name: Denise Hartman MRN: [id number] Date of Birth: 12-26-1955  Transition of Care [facility]) CM/SW Contact  Adrian Prows, RN Phone Number: 07/03/2022, 12:52 PM  Clinical Narrative:    [facility] consult for insulin cost; [facility] closed; called [facility] Ginette Otto; spoke with Maurine Minister Technician; she says the cash price is $110.79/pen and this would be approximately 28-30 day supply; she also says she can not given a cost with insurance unless the prescription was actually called in; called pharmacy listed on d/c [facility] [place], [place]); message says pharmacy closed; no TOC needs   Expected Discharge Plan: Home w Home Health Services Barriers to Discharge: Continued Medical Work up  Expected Discharge Plan and Services Expected Discharge Plan: Home w Home Health Services In-house Referral: Clinical Social Work   Post Acute Care Choice: Home Health, Durable Medical Equipment Living arrangements for the past 2 months: Single Family Home Expected Discharge Date: 07/03/22               DME Arranged: Dan Humphreys rolling with seat, Bedside commode   Date DME Agency Contacted: 07/01/22 Time DME Agency Contacted: 1145 Representative spoke with at DME Agency: [facility] HH Arranged: PT HH Agency: [facility] Date [facility] Agency Contacted: 07/01/22 Time HH Agency Contacted: 1145 Representative spoke with at [facility] Agency: Amy   Social Determinants of Health (SDOH) Interventions Food Insecurity Interventions: Patient Refused Housing Interventions: Patient Refused Transportation Interventions: Patient Refused Utilities Interventions: Patient Refused  Readmission Risk Interventions    06/30/2022    1:56 PM  Readmission Risk Prevention Plan  Post Dischage Appt Complete  Medication Screening Complete  Transportation Screening Complete

## 2022-07-03 NOTE — Discharge Summary (Signed)
Physician Discharge Summary  Rockwell GermanyDonna K Cruzen ZOX:[id number] DOB: 24-Mar-1956 DOA: 06/29/2022  PCP: Tommie Samsook, Jayce G, DO  Admit date: 06/29/2022 Discharge date: 07/03/2022 Recommendations for Outpatient Follow-up:  Follow up with PCP in 1 weeks-call for appointment Please obtain BMP/CBC in one week  Discharge Dispo: HHPTOT Discharge Condition: Stable Code Status:   Code Status: Full Code Diet recommendation:  Diet Order             Diet regular Room service appropriate? Yes; Fluid consistency: Thin  Diet effective now                    Brief/Interim Summary: 66 year old female with T2DM HTN anxiety, GERD seen after a fall after a fall found to have right femur fracture comminuted and displaced, Dr. Chrissie NoaWilliam from orthopedic was discussed and [facility] for operative intervention. 11/20: s/p Right THA>.  Significant AKI noted postop, also had soft blood pressure. 11/22: Creatinine improved with IV fluids but hemoglobin drifting  AKI has resolved with IV fluids.  Received 1 unit PRBC for acute blood loss anemia due to blood loss during surgery.  Hemoglobin appropriately increased.  Cleared by PT OT for home health PT, and is medically stable for discharge.  Discharge Diagnoses:  Principal Problem:   Hip fracture (HCC) Active Problems:   Depression with anxiety   Gastroesophageal reflux disease without esophagitis   Hypertension   Mixed hyperlipidemia   Overweight with body mass index (BMI) of 28 to 28.9 in adult   Type 2 diabetes mellitus with diabetic polyneuropathy, without long-term current use of insulin (HCC)   S/P total right hip arthroplasty  Right femoral neck fracture secondary to fall: 11/20: s/p Right THA.  Continue DVT prophylaxis, pain control PT OT as per orthopedics> overall doing well planning for home health PT OT. Ortho recs ASA 81 mg bid x 4wk, also on cefadroxil postop x2 weeks.   Acute blood loss anemia due to blood loss from  surgery Folate deficiency anemia Borderline B12: Hemoglobin improved with 1 unit PRBC transfusion.  We will replete with B12 and folic acid, check CBC in 1 week from PCP  Recent Labs  Lab 06/29/22 1335 06/30/22 0456 07/01/22 0434 07/02/22 0438 07/03/22 0446  HGB 12.6 12.8 9.2* 6.9* 8.4*  HCT 39.0 40.0 28.8* 21.6* 25.9*    AKI: Likely multifactorial in the setting of #1/acute blood loss anemia/dehydration/hypotension.managed with aggressive IV fluid hydration and AKI has resolved.    Hypotension/soft blood pressure: resolved /w ivf. Acei on hold.     Type II diabetes mellitus: Blood sugar poorly controlled> placed on Lantus SSI.  at home on Actos and metformin - not taking Trulicity> agreed to take levemir> has done inj for her mother and aware abt insulin inj and hypo and hyper glycemia  Recent Labs  Lab 06/26/22 1354 06/29/22 2004 07/02/22 2018 07/02/22 2358 07/03/22 0354 07/03/22 0718 07/03/22 1138  GLUCAP  --    < > 291* 210* 163* 159* 174*  HGBA1C 9.6*  --   --   --   --   --   --    < > = values in this interval not displayed.    Hypertension: BP  stable now-hold home lisinopril 2.5 mg GERD: Continue PPI. Anxiety:Continue her Neurontin, Effexor  Consults: Orhtopedics  Subjective: Alert awake oriented resting comfortably.Eager to go home.Reports that she can manage insulin injection at home,has done that for her mother.  Discharge Exam: Vitals:   07/02/22 2027  07/03/22 0540  BP: (!) 171/73 (!) 151/62  Pulse: 87 89  Resp: 18 18  Temp: 98.4 F (36.9 C) 98.7 F (37.1 C)  SpO2: 100% 94%  General: Pt is alert, awake, not in acute distress Cardiovascular: RRR, S1/S2 +, no rubs, no gallops Respiratory: CTA bilaterally, no wheezing, no rhonchi Abdominal: Soft, NT, ND, bowel sounds + Extremities: no edema, no cyanosis  Discharge Instructions  Discharge Instructions     Discharge instructions   Complete by: As directed    Please call call MD or return to ER  for similar or worsening recurring problem that brought you to hospital or if any fever,nausea/vomiting,abdominal pain, uncontrolled pain, chest pain,  shortness of breath or any other alarming symptoms.  Please follow-up your doctor as instructed in a week time and call the office for appointment. Recheck CBC in 1 week  Please avoid alcohol, smoking, or any other illicit substance and maintain healthy habits including taking your regular medications as prescribed.  You were cared for by a hospitalist during your hospital stay. If you have any questions about your discharge medications or the care you received while you were in the hospital after you are discharged, you can call the unit and ask to speak with the hospitalist on call if the hospitalist that took care of you is not available.  Once you are discharged, your primary care physician will handle any further medical issues. Please note that NO REFILLS for any discharge medications will be authorized once you are discharged, as it is imperative that you return to your primary care physician (or establish a relationship with a primary care physician if you do not have one) for your aftercare needs so that they can reassess your need for medications and monitor your lab values  Check blood sugar 3 times a day and bedtime at home. If blood sugar running above 200 less than 70 please call your MD to adjust insulin. If blood sugars running less 100 do not use insulin and call MD. If you noticed signs and symptoms of hypoglycemia or low blood sugar like jitteriness, confusion, thirst, tremor, sweating- Check blood sugar, drink sugary drink/biscuits/sweets to increase sugar level and call MD or return to ER.   Discharge wound care:   Complete by: As directed    Reinforce dressing, follow-up with orthopedic for further care   Increase activity slowly   Complete by: As directed       Allergies as of 07/03/2022       Reactions   Shellfish  Allergy Anaphylaxis   Latex Itching   Oxycodone Itching   Penicillins Hives, Itching   Sulfa Antibiotics         Medication List     STOP taking these medications    acetaminophen 500 MG tablet Commonly known as: TYLENOL       TAKE these medications    ARTIFICIAL TEAR OP Apply 1 drop to eye as needed. For dry eyes   aspirin 81 MG chewable tablet Chew 1 tablet (81 mg total) by mouth 2 (two) times daily for 28 days.   cefadroxil 500 MG capsule Commonly known as: DURICEF Take 1 capsule (500 mg total) by mouth 2 (two) times daily for 14 days.   cetirizine 10 MG tablet Commonly known as: ZYRTEC Take by mouth.   docusate sodium 100 MG capsule Commonly known as: COLACE Take 1 capsule (100 mg total) by mouth 2 (two) times daily.   folic acid 1 MG tablet Commonly  known as: FOLVITE Take 1 tablet (1 mg total) by mouth daily.   FreeStyle [place] 2 Reader Cowles Use as directed to check blood glucose.   FreeStyle Libre 2 Sensor Misc Apply as directed to check blood glucose. Change every 14 days.   gabapentin 300 MG capsule Commonly known as: NEURONTIN TAKE 2 CAPSULES BY MOUTH NIGHTLY   HYDROcodone-acetaminophen 5-325 MG tablet Commonly known as: NORCO/VICODIN Take 1 tablet by mouth every 4 (four) hours as needed for severe pain.   insulin detemir 100 UNIT/ML FlexPen Commonly known as: LEVEMIR Inject 10 Units into the skin daily.   Insulin Pen Needle 31G X 5 MM Misc 1 each by Does not apply route daily at 6 (six) AM.   lisinopril 2.5 MG tablet Commonly known as: ZESTRIL Take 1 tablet by mouth every morning.   metFORMIN 500 MG tablet Commonly known as: GLUCOPHAGE Take 2 tablets (1,000 mg total) by mouth 2 (two) times daily.   methocarbamol 500 MG tablet Commonly known as: ROBAXIN Take 1 tablet (500 mg total) by mouth every 6 (six) hours as needed for muscle spasms.   Omeprazole 20 MG Tbec Take 20 mg by mouth.   pioglitazone 30 MG tablet Commonly known as:  ACTOS Take 30 mg by mouth daily.   polyethylene glycol 17 g packet Commonly known as: MIRALAX / GLYCOLAX Take 17 g by mouth 2 (two) times daily.   Trulicity 0.75 MG/0.5ML Sopn Generic drug: Dulaglutide Inject 0.75 mg into the skin once a week.   venlafaxine XR 75 MG 24 hr capsule Commonly known as: EFFEXOR-XR Take 2 capsules (150 mg total) by mouth at bedtime.   vitamin B-12 500 MCG tablet Commonly known as: CYANOCOBALAMIN Take 1 tablet (500 mcg total) by mouth daily.               Durable Medical Equipment  (From admission, onward)           Start     Ordered   07/01/22 1104  For home use only DME Walker rolling  Once       Question Answer Comment  Walker: With 5 Inch Wheels   Patient needs a walker to treat with the following condition Difficulty in walking, not elsewhere classified      07/01/22 1103   07/01/22 1104  For home use only DME 3 n 1  Once        07/01/22 1103              Discharge Care Instructions  (From admission, onward)           Start     Ordered   07/03/22 0000  Discharge wound care:       Comments: Reinforce dressing, follow-up with orthopedic for further care   07/03/22 1208            Follow-up Information     Durene Romans, MD. Schedule an appointment as soon as possible for a visit in 2 week(s).   Specialty: Orthopedic Surgery Contact information: [address] [place] [address] [place] [address] [phone]         Tommie Sams, DO Follow up in 1 week(s).   Specialty: Family Medicine Contact information: [address] [place] [address] [phone]                Allergies  Allergen Reactions   Shellfish Allergy Anaphylaxis   Latex Itching   Oxycodone Itching   Penicillins Hives and Itching  Sulfa Antibiotics     The results of significant diagnostics from this hospitalization (including imaging, microbiology, ancillary and laboratory) are listed below for reference.     Microbiology: Recent Results (from the past 240 hour(s))  Surgical PCR screen     Status: Abnormal   Collection Time: 06/29/22  6:29 PM   Specimen: Nasal Mucosa; Nasal Swab  Result Value Ref Range Status   MRSA, PCR NEGATIVE NEGATIVE Final   Staphylococcus aureus POSITIVE (A) NEGATIVE Final    Comment: (NOTE) The Xpert SA Assay (FDA approved for NASAL specimens in patients 15 years of age and older), is one component of a comprehensive surveillance program. It is not intended to diagnose infection nor to guide or monitor treatment. Performed at [facility], [address], [place], [place] [address]     Procedures/Studies: DG Pelvis Portable  Result Date: 06/30/2022 CLINICAL DATA:  Right hip arthroplasty. EXAM: PORTABLE PELVIS 1-2 VIEWS COMPARISON:  None Available. FINDINGS: Right hip arthroplasty in expected alignment. No periprosthetic lucency or fracture. Recent postsurgical change includes air and edema in the soft tissues. IMPRESSION: Right hip arthroplasty without immediate postoperative complication. Electronically Signed   By: Narda Rutherford M.D.   On: 06/30/2022 18:39   DG HIP UNILAT WITH PELVIS 1V RIGHT  Result Date: 06/30/2022 CLINICAL DATA:  Right hip surgery. EXAM: DG HIP (WITH OR WITHOUT PELVIS) 1V RIGHT COMPARISON:  Preoperative radiographs FINDINGS: Three fluoroscopic spot views of the pelvis and right hip obtained in the operating room. Portions of a right hip arthroplasty are in place. Fluoroscopy time 12 seconds. Dose 12.2 mGy. IMPRESSION: Intraoperative fluoroscopy during right hip arthroplasty. Electronically Signed   By: Narda Rutherford M.D.   On: 06/30/2022 17:54   DG C-Arm 1-60 Min-No Report  Result Date: 06/30/2022 Fluoroscopy was utilized by the requesting physician.  No radiographic interpretation.   DG Chest Port 1 View  Result Date: 06/29/2022 CLINICAL DATA:  Fall, hip fracture EXAM: PORTABLE CHEST 1 VIEW COMPARISON:   11/13/2021 FINDINGS: The heart size and mediastinal contours are within normal limits. Both lungs are clear. The visualized skeletal structures are unremarkable. IMPRESSION: No active disease. Electronically Signed   By: Duanne Guess D.O.   On: 06/29/2022 13:30   DG Hip Unilat  With Pelvis 2-3 Views Right  Result Date: 06/29/2022 CLINICAL DATA:  Trauma, fall EXAM: DG HIP (WITH OR WITHOUT PELVIS) 2-3V RIGHT COMPARISON:  None Available. FINDINGS: Subcapital fracture is seen in the neck of right femur. Lays over riding of fracture fragments. There is no dislocation. IMPRESSION: Displaced comminuted fracture is seen in the neck of right femur. Electronically Signed   By: Ernie Avena M.D.   On: 06/29/2022 11:40    Labs: BNP (last 3 results) No results for input(s): "BNP" in the last 8760 hours. Basic Metabolic Panel: Recent Labs  Lab 06/29/22 1335 06/30/22 0456 07/01/22 0434 07/02/22 0438 07/03/22 0446  NA 136 135 133* 139 137  K 3.8 4.0 4.5 4.5 3.9  CL 98 98 98 109 107  CO2 GLUCOSE 165* 187* 207* 199* 174*  BUN 22 18 31* 31* 21  CREATININE 0.65 0.60 1.57* 0.66 0.62  CALCIUM 9.8 9.0 7.9* 7.4* 8.0*  MG  --  1.4*  --   --   --    Liver Function Tests: Recent Labs  Lab 06/29/22 1335  AST 39  ALT 29  ALKPHOS 88  BILITOT 1.0  PROT 6.9  ALBUMIN 3.7  No results for input(s): "LIPASE", "AMYLASE" in the last 168 hours. No results for input(s): "AMMONIA" in the last 168 hours. CBC: Recent Labs  Lab 06/29/22 1335 06/30/22 0456 07/01/22 0434 07/02/22 0438 07/03/22 0446  WBC 7.8 6.1 12.5* 7.0 6.0  NEUTROABS 4.8  --   --   --   --   HGB 12.6 12.8 9.2* 6.9* 8.4*  HCT 39.0 40.0 28.8* 21.6* 25.9*  MCV 89.2 91.3 91.7 92.3 90.2  PLT 163 170 157 104* 140*   Cardiac Enzymes: No results for input(s): "CKTOTAL", "CKMB", "CKMBINDEX", "TROPONINI" in the last 168 hours. BNP: Invalid input(s): "POCBNP" CBG: Recent Labs  Lab 07/02/22 2018 07/02/22 2358  07/03/22 0354 07/03/22 0718 07/03/22 1138  GLUCAP 291* 210* 163* 159* 174*   D-Dimer No results for input(s): "DDIMER" in the last 72 hours. Hgb A1c No results for input(s): "HGBA1C" in the last 72 hours. Lipid Profile No results for input(s): "CHOL", "HDL", "LDLCALC", "TRIG", "CHOLHDL", "LDLDIRECT" in the last 72 hours. Thyroid function studies No results for input(s): "TSH", "T4TOTAL", "T3FREE", "THYROIDAB" in the last 72 hours.  Invalid input(s): "FREET3" Anemia work up Recent Labs    07/02/22 0438  VITAMINB12 198  FOLATE 5.6*  FERRITIN 242  TIBC 266  IRON 34   Urinalysis    Component Value Date/Time   COLORURINE YELLOW 09/21/2011 1508   APPEARANCEUR CLOUDY (A) 09/21/2011 1508   LABSPEC 1.025 09/21/2011 1508   PHURINE 5.5 09/21/2011 1508   GLUCOSEU NEGATIVE 09/21/2011 1508   HGBUR TRACE (A) 09/21/2011 1508   BILIRUBINUR ++ 12/01/2012 1334   KETONESUR NEGATIVE 09/21/2011 1508   PROTEINUR NEGATIVE 09/21/2011 1508   UROBILINOGEN 0.2 09/21/2011 1508   NITRITE NEGATIVE 09/21/2011 1508   LEUKOCYTESUR SMALL (A) 09/21/2011 1508   Sepsis Labs Recent Labs  Lab 06/30/22 0456 07/01/22 0434 07/02/22 0438 07/03/22 0446  WBC 6.1 12.5* 7.0 6.0   Microbiology Recent Results (from the past 240 hour(s))  Surgical PCR screen     Status: Abnormal   Collection Time: 06/29/22  6:29 PM   Specimen: Nasal Mucosa; Nasal Swab  Result Value Ref Range Status   MRSA, PCR NEGATIVE NEGATIVE Final   Staphylococcus aureus POSITIVE (A) NEGATIVE Final    Comment: (NOTE) The Xpert SA Assay (FDA approved for NASAL specimens in patients 61 years of age and older), is one component of a comprehensive surveillance program. It is not intended to diagnose infection nor to guide or monitor treatment. Performed at [facility], [address], [place], [place] [address]   Time coordinating discharge: 25 minutes SIGNED: Lanae Boast, MD  [facility] 07/03/2022, 12:13  PM  If 7PM-7AM, please contact night-coverage [email or web address]

## 2022-07-03 NOTE — Progress Notes (Signed)
Physical Therapy Treatment Patient Details Name: Denise Hartman MRN: [id number] DOB: [birth year] Today's Date: 07/03/2022   History of Present Illness 66 y.o. female with medical history significant for type 2 diabetes, hypertension, anxiety, and GERD who was brought to the ED via EMS today after she had a fall at her [place] in [place].  She stumbled over one of the steps and fell on her right side and started to complain of right-sided hip pain. Dx of R hip fx, s/p AA-THA 06/30/22.    PT Comments    Pt is mobilizing well and is ready to DC home from a PT standpoint. She ambulated 46' with a RW, completed stair training, and demonstrates good understanding of HEP.     Recommendations for follow up therapy are one component of a multi-disciplinary discharge planning process, led by the attending physician.  Recommendations may be updated based on patient status, additional functional criteria and insurance authorization.  Follow Up Recommendations  Home health PT     Assistance Recommended at Discharge Intermittent Supervision/Assistance  Patient can return home with the following A little help with bathing/dressing/bathroom;Assistance with cooking/housework;Help with stairs or ramp for entrance;Assist for transportation   Equipment Recommendations  Rolling walker (2 wheels);BSC/3in1    Recommendations for Other Services       Precautions / Restrictions Precautions Precautions: Fall Restrictions Weight Bearing Restrictions: No RLE Weight Bearing: Weight bearing as tolerated     Mobility  Bed Mobility Overal bed mobility: Modified Independent Bed Mobility: Supine to Sit     Supine to sit: Modified independent (Device/Increase time), HOB elevated     General bed mobility comments: used bedrail    Transfers Overall transfer level: Modified independent Equipment used: Rolling walker (2 wheels) Transfers: Sit to/from Stand Sit to Stand: Modified independent (Device/Increase  time)                Ambulation/Gait Ambulation/Gait assistance: Modified independent (Device/Increase time) Gait Distance (Feet): 110 Feet Assistive device: Rolling walker (2 wheels) Gait Pattern/deviations: Step-to pattern, Decreased step length - right, Decreased step length - left Gait velocity: decr     General Gait Details: steady, no loss of balance   Stairs Stairs: Yes Stairs assistance: Min guard Stair Management: One rail Left, Step to pattern, Forwards, With cane Number of Stairs: 3 General stair comments: VCs sequencing   Wheelchair Mobility    Modified Rankin (Stroke Patients Only)       Balance Overall balance assessment: Modified Independent                                          Cognition Arousal/Alertness: Awake/alert Behavior During Therapy: WFL for tasks assessed/performed Overall Cognitive Status: Within Functional Limits for tasks assessed                                          Exercises Total Joint Exercises Ankle Circles/Pumps: AROM, Both, 10 reps, Supine Quad Sets: AROM, Right, 5 reps, Supine Short Arc Quad: AROM, Right, 10 reps Heel Slides: AAROM, Right, Supine, 10 reps Hip ABduction/ADduction: AAROM, Right, Supine, 10 reps Long Arc Quad: AROM, Right, 10 reps, Seated    General Comments        Pertinent Vitals/Pain Pain Assessment Pain Score: 4  Pain Location: R hip with walking Pain  Descriptors / Indicators: Sore Pain Intervention(s): Limited activity within patient's tolerance, Monitored during session, Repositioned, Ice applied    Home Living                          Prior Function            PT Goals (current goals can now be found in the care plan section) Acute Rehab PT Goals Patient Stated Goal: to be able to drive, do housework PT Goal Formulation: With patient Time For Goal Achievement: 07/08/22 Potential to Achieve Goals: Good Progress towards PT goals:  Progressing toward goals    Frequency    Min 6X/week      PT Plan Current plan remains appropriate    Co-evaluation              AM-PAC PT "6 Clicks" Mobility   Outcome Measure  Help needed turning from your back to your side while in a flat bed without using bedrails?: None Help needed moving from lying on your back to sitting on the side of a flat bed without using bedrails?: A Little Help needed moving to and from a bed to a chair (including a wheelchair)?: None Help needed standing up from a chair using your arms (e.g., wheelchair or bedside chair)?: None Help needed to walk in hospital room?: None Help needed climbing 3-5 steps with a railing? : A Little 6 Click Score: 22    End of Session Equipment Utilized During Treatment: Gait belt Activity Tolerance: Patient tolerated treatment well Patient left: in chair;with call bell/phone within reach;with chair alarm set Nurse Communication: Mobility status PT Visit Diagnosis: Difficulty in walking, not elsewhere classified (R26.2);Pain Pain - Right/Left: Right Pain - part of body: Hip     Time: 0370-9643 PT Time Calculation (min) (ACUTE ONLY): 24 min  Charges:  $Gait Training: 8-22 mins $Therapeutic Exercise: 8-22 mins                     Ralene Bathe Kistler PT 07/03/2022  Acute Rehabilitation Services  Office [phone]

## 2022-07-03 NOTE — Progress Notes (Signed)
Patient discharged. Discharge instructions reviewed with patient. She verbalized understanding. IV removed. Her ride just got here she is getting ready to go.

## 2022-07-03 NOTE — TOC Transition Note (Addendum)
Transition of Care [facility]) - CM/SW Discharge Note   Patient Details  Name: Denise Hartman MRN: [id number] Date of Birth: 07/15/1956  [facility]) CM/SW Contact:  Adrian Prows, RN Phone Number: 07/03/2022, 12:20 PM   Clinical Narrative:    D/C orders written for pt; spoke with pt on room phone; she verified that equipment was delivered to her room by [employer]; she also verifies her address for HHPT ([address], [place] [phone]); the pt says her cousin will pick her up from hospital; LVMs for [facility] and Cassie at [place] to notify them of pt d/c; awaiting return calls; no TOC needs.  -1312- verified pt's d/c address with Mayra Reel at [place].   Final next level of care: Home w Home Health Services Barriers to Discharge: Continued Medical Work up   Patient Goals and CMS Choice Patient states their goals for this hospitalization and ongoing recovery are:: return home CMS Medicare.gov Compare Post Acute Care list provided to:: Patient Choice offered to / list presented to : Patient  Discharge Placement                       Discharge Plan and Services In-house Referral: Clinical Social Work   Post Acute Care Choice: Home Health, Durable Medical Equipment          DME Arranged: Walker rolling with seat, Bedside commode   Date DME Agency Contacted: 07/01/22 Time DME Agency Contacted: 1145 Representative spoke with at DME Agency: kristen HH Arranged: PT HH Agency: [facility] Date [facility] Agency Contacted: 07/01/22 Time HH Agency Contacted: 1145 Representative spoke with at [facility] Agency: Amy  Social Determinants of Health (SDOH) Interventions Food Insecurity Interventions: Patient Refused Housing Interventions: Patient Refused Transportation Interventions: Patient Refused Utilities Interventions: Patient Refused   Readmission Risk Interventions    06/30/2022    1:56 PM  Readmission Risk Prevention Plan  Post Dischage Appt Complete   Medication Screening Complete  Transportation Screening Complete

## 2022-07-07 ENCOUNTER — Telehealth: Payer: Self-pay

## 2022-07-07 NOTE — Telephone Encounter (Signed)
Transition Care Management Unsuccessful Follow-up Telephone Call  Date of discharge and from where:  TCM DC [facility] 07/03/22 Dx: hip fracture  Attempts:  1st Attempt  Reason for unsuccessful TCM follow-up call:  Left voice message   Woodfin Ganja LPN [facility] Nurse Health Advisor Direct Dial [phone]

## 2022-07-08 NOTE — Telephone Encounter (Signed)
Transition Care Management Follow-up Telephone Call Date of discharge and from where:  TCM DC [facility] 07/03/22 Dx: hip fracture    How have you been since you were released from the hospital? Doing good  Any questions or concerns? No  Items Reviewed: Did the pt receive and understand the discharge instructions provided? Yes  Medications obtained and verified? Yes  Other? No  Any new allergies since your discharge? No  Dietary orders reviewed? Yes Do you have support at home? Yes   Home Care and Equipment/Supplies: Were home health services ordered? Yes PT/OT If so, what is the name of the agency? Unsure of name   Has the agency set up a time to come to the patient's home? no Were any new equipment or medical supplies ordered?  Yes: RW/ BSC  What is the name of the medical supply agency? Hospital  Were you able to get the supplies/equipment? yes Do you have any questions related to the use of the equipment or supplies? No  Functional Questionnaire: (I = Independent and D = Dependent) ADLs: I  Bathing/Dressing- I  Meal Prep- I  Eating- I  Maintaining continence- I  Transferring/Ambulation- I  Managing Meds- I  Follow up appointments reviewed:  PCP Hospital f/u appt confirmed? Yes  [facility]  Scheduled to see Dr Charlann Boxer on 07-16-22 @ 220pm. Are transportation arrangements needed? No  If their condition worsens, is the pt aware to call PCP or go to the Emergency Dept.? Yes Was the patient provided with contact information for the PCP's office or ED? Yes Was to pt encouraged to call back with questions or concerns? Yes   Woodfin Ganja LPN [facility] Nurse Health Advisor Direct Dial [phone]

## 2022-07-16 ENCOUNTER — Ambulatory Visit (INDEPENDENT_AMBULATORY_CARE_PROVIDER_SITE_OTHER): Payer: 59 | Admitting: Family Medicine

## 2022-07-16 ENCOUNTER — Encounter: Payer: Self-pay | Admitting: Family Medicine

## 2022-07-16 VITALS — BP 105/66 | HR 77 | Resp 98 | Wt 177.0 lb

## 2022-07-16 DIAGNOSIS — I10 Essential (primary) hypertension: Secondary | ICD-10-CM | POA: Diagnosis not present

## 2022-07-16 DIAGNOSIS — Z780 Asymptomatic menopausal state: Secondary | ICD-10-CM | POA: Diagnosis not present

## 2022-07-16 DIAGNOSIS — S72001D Fracture of unspecified part of neck of right femur, subsequent encounter for closed fracture with routine healing: Secondary | ICD-10-CM | POA: Diagnosis not present

## 2022-07-16 DIAGNOSIS — D500 Iron deficiency anemia secondary to blood loss (chronic): Secondary | ICD-10-CM | POA: Diagnosis not present

## 2022-07-16 DIAGNOSIS — E1142 Type 2 diabetes mellitus with diabetic polyneuropathy: Secondary | ICD-10-CM

## 2022-07-16 DIAGNOSIS — R7989 Other specified abnormal findings of blood chemistry: Secondary | ICD-10-CM | POA: Diagnosis not present

## 2022-07-16 NOTE — Patient Instructions (Addendum)
Labs today.  Watch diet.  Continue current medications.  Follow up in 3 months or sooner if needed.

## 2022-07-17 DIAGNOSIS — D500 Iron deficiency anemia secondary to blood loss (chronic): Secondary | ICD-10-CM | POA: Insufficient documentation

## 2022-07-17 DIAGNOSIS — R7989 Other specified abnormal findings of blood chemistry: Secondary | ICD-10-CM | POA: Insufficient documentation

## 2022-07-17 LAB — CBC
Hematocrit: 34.3 % (ref 34.0–46.6)
Hemoglobin: 11.1 g/dL (ref 11.1–15.9)
MCH: 28.8 pg (ref 26.6–33.0)
MCHC: 32.4 g/dL (ref 31.5–35.7)
MCV: 89 fL (ref 79–97)
Platelets: 409 10*3/uL (ref 150–450)
RBC: 3.85 x10E6/uL (ref 3.77–5.28)
RDW: 13.2 % (ref 11.7–15.4)
WBC: 7.9 10*3/uL (ref 3.4–10.8)

## 2022-07-17 LAB — IRON,TIBC AND FERRITIN PANEL
Ferritin: 608 ng/mL — ABNORMAL HIGH (ref 15–150)
Iron Saturation: 20 % (ref 15–55)
Iron: 74 ug/dL (ref 27–139)
Total Iron Binding Capacity: 369 ug/dL (ref 250–450)
UIBC: 295 ug/dL (ref 118–369)

## 2022-07-17 LAB — VITAMIN B12: Vitamin B-12: 580 pg/mL (ref 232–1245)

## 2022-07-17 NOTE — Assessment & Plan Note (Addendum)
Doing well following hospitalization.  Pain is well-controlled.  Follow-up with surgeon. Arranging for DEXA scan.

## 2022-07-17 NOTE — Assessment & Plan Note (Signed)
Blood glucose levels are now well-controlled.  She has a CGM and is doing well following addition of Levemir.  Continue Levemir 10 units, Actos, and metformin.  May need decrease in insulin as patient's physical activity level increases and if she is more compliant with dietary recommendations.

## 2022-07-17 NOTE — Progress Notes (Signed)
Subjective:  Patient ID: Denise Hartman, female    DOB: 12-Jun-1956  Age: 66 y.o. MRN: [id number]  CC: Chief Complaint  Patient presents with   Hospitalization Follow-up    Pt fell at church 2 weeks ago. Pt had right hip replacement that Monday. Stayed at [facility] from 06/29/22-07/03/22. Pt reports having yeast infection.     HPI:  66 year old female with type 2 diabetes, hypertension, hyperlipidemia presents for hospital follow-up.  Patient was recently seen by me.  Few days later she suffered a fall at church which resulted in a hip fracture.  She was admitted to hospital from 11/19 to 11/23.  Hospital course, notes, discharge summary reviewed and summarized below:  Patient suffered a right comminuted femur fracture.  Underwent surgery.  Had acute blood loss anemia which required transfusion.  Also found to have low folate and low B12 which was repleted.  Additionally, also suffered a mild AKI which was treated with IV fluid hydration.  Patient did well during her hospital course.  Also, patient was placed on Levemir and her blood sugars have drastically improved.  Patient was discharged home in stable condition.  She has upcoming follow-up with her surgeon.  Patient states that she is doing fairly well at this time.  Minimal pain.  She is not taking any pain medication at this time.  Patient does still feel slightly fatigued.  Advised her that it takes some time to recover from her surgery and hospitalization.  Patient needs a repeat CBC to evaluate underlying anemia.  Patient's blood sugars are well-controlled at this time following addition of 10 units of Levemir.  She remains on Actos and metformin as well.  Additionally, blood pressure is well-controlled today.  Patient compliant with lisinopril.   Patient Active Problem List   Diagnosis Date Noted   Blood loss anemia 07/17/2022   Low vitamin B12 level 07/17/2022   S/P total right hip arthroplasty 06/30/2022   Hip fracture (HCC)  06/29/2022   Right shoulder pain 06/27/2022   Hypertension 12/26/2020   Gastroesophageal reflux disease without esophagitis 11/19/2020   Mixed hyperlipidemia 02/09/2020   Depression with anxiety 03/24/2018   Type 2 diabetes mellitus with diabetic polyneuropathy, without long-term current use of insulin (HCC) 03/24/2018    Social Hx   Social History   Socioeconomic History   Marital status: Divorced    Spouse name: Not on file   Number of children: Not on file   Years of education: Not on file   Highest education level: Not on file  Occupational History   Not on file  Tobacco Use   Smoking status: Never   Smokeless tobacco: Not on file  Substance and Sexual Activity   Alcohol use: No   Drug use: No   Sexual activity: Yes    Birth control/protection: Surgical  Other Topics Concern   Not on file  Social History Narrative   Not on file   Social Determinants of Health   Financial Resource Strain: Not on file  Food Insecurity: Not on file  Transportation Needs: Not on file  Physical Activity: Not on file  Stress: Not on file  Social Connections: Not on file    Review of Systems Per HPI  Objective:  BP 105/66   Pulse 77   Resp (!) 98   Wt 177 lb (80.3 kg)   BMI 27.72 kg/m      07/16/2022   10:23 AM 07/03/2022    1:01 PM 07/03/2022  5:40 AM  BP/Weight  Systolic BP 105 140 151  Diastolic BP 66 53 62  Wt. (Lbs) 177    BMI 27.72 kg/m2      Physical Exam Vitals and nursing note reviewed.  Constitutional:      General: She is not in acute distress.    Appearance: Normal appearance.  HENT:     Head: Normocephalic and atraumatic.  Cardiovascular:     Rate and Rhythm: Normal rate and regular rhythm.  Pulmonary:     Effort: Pulmonary effort is normal.     Breath sounds: Normal breath sounds. No wheezing, rhonchi or rales.  Neurological:     Mental Status: She is alert.  Psychiatric:        Mood and Affect: Mood normal.        Behavior: Behavior  normal.     Lab Results  Component Value Date   WBC 7.9 07/16/2022   HGB 11.1 07/16/2022   HCT 34.3 07/16/2022   PLT 409 07/16/2022   GLUCOSE 174 (H) 07/03/2022   CHOL 264 (H) 03/03/2022   TRIG 259 (H) 03/03/2022   HDL 56 03/03/2022   LDLCALC 160 (H) 03/03/2022   ALT 29 06/29/2022   AST 39 06/29/2022   NA 137 07/03/2022   K 3.9 07/03/2022   CL 107 07/03/2022   CREATININE 0.62 07/03/2022   BUN 21 07/03/2022   CO2 28 07/03/2022   TSH 4.050 06/26/2022   INR 0.9 08/23/2007   HGBA1C 9.6 (H) 06/26/2022     Assessment & Plan:   Problem List Items Addressed This Visit       Cardiovascular and Mediastinum   Hypertension    Blood pressure well-controlled.  Continue lisinopril.        Endocrine   Type 2 diabetes mellitus with diabetic polyneuropathy, without long-term current use of insulin (HCC)    Blood glucose levels are now well-controlled.  She has a CGM and is doing well following addition of Levemir.  Continue Levemir 10 units, Actos, and metformin.  May need decrease in insulin as patient's physical activity level increases and if she is more compliant with dietary recommendations.        Musculoskeletal and Integument   Hip fracture (HCC) - Primary    Doing well following hospitalization.  Pain is well-controlled.  Follow-up with surgeon. Arranging for DEXA scan.        Other   Blood loss anemia    Rechecking CBC today.      Relevant Orders   Iron, TIBC and Ferritin Panel (Completed)   Vitamin B12 (Completed)   Low vitamin B12 level    Rechecking B12.  Continue oral supplementation.      Relevant Orders   CBC (Completed)   Other Visit Diagnoses     Post-menopausal       Relevant Orders   DG Bone Density      Follow-up:  Return in about 3 months (around 10/15/2022).  Everlene Other DO [facility]

## 2022-07-17 NOTE — Assessment & Plan Note (Signed)
Blood pressure well-controlled.  Continue lisinopril.

## 2022-07-17 NOTE — Assessment & Plan Note (Signed)
Rechecking B12.  Continue oral supplementation.

## 2022-07-17 NOTE — Assessment & Plan Note (Signed)
Rechecking CBC today

## 2022-08-06 DIAGNOSIS — Z96641 Presence of right artificial hip joint: Secondary | ICD-10-CM | POA: Diagnosis not present

## 2022-08-21 ENCOUNTER — Other Ambulatory Visit: Payer: Self-pay

## 2022-08-21 MED ORDER — METFORMIN HCL 500 MG PO TABS: 1000.0000 mg | ORAL_TABLET | Freq: Two times a day (BID) | ORAL | 2 refills | Status: AC

## 2022-09-03 ENCOUNTER — Ambulatory Visit: Payer: No Typology Code available for payment source | Admitting: Family Medicine

## 2022-09-05 ENCOUNTER — Other Ambulatory Visit: Payer: Self-pay | Admitting: *Deleted

## 2022-09-05 MED ORDER — VENLAFAXINE HCL ER 75 MG PO CP24: 150.0000 mg | ORAL_CAPSULE | Freq: Every day | ORAL | 0 refills | Status: AC

## 2022-09-22 ENCOUNTER — Other Ambulatory Visit: Payer: Self-pay | Admitting: Family Medicine

## 2022-09-23 ENCOUNTER — Other Ambulatory Visit: Payer: Self-pay | Admitting: *Deleted

## 2022-09-23 MED ORDER — PIOGLITAZONE HCL 30 MG PO TABS: 30.0000 mg | ORAL_TABLET | Freq: Every day | ORAL | 0 refills | Status: AC

## 2022-10-13 ENCOUNTER — Other Ambulatory Visit: Payer: Self-pay | Admitting: Family Medicine

## 2022-10-13 DIAGNOSIS — Z1231 Encounter for screening mammogram for malignant neoplasm of breast: Secondary | ICD-10-CM

## 2022-10-15 ENCOUNTER — Ambulatory Visit: Payer: 59 | Admitting: Family Medicine
